# Patient Record
Sex: Male | Born: 1980 | Race: Black or African American | Hispanic: No | Marital: Married | Smoking: Current some day smoker
Health system: Southern US, Community
[De-identification: ages and names within clinical notes are randomized; demographics above are authoritative.]

## PROBLEM LIST (undated history)

## (undated) ENCOUNTER — Emergency Department (HOSPITAL_COMMUNITY): Admission: EM | Disposition: A | Discharge status: Home / Self Care | Payer: Medicaid Other

## (undated) ENCOUNTER — Emergency Department (HOSPITAL_BASED_OUTPATIENT_CLINIC_OR_DEPARTMENT_OTHER): Admission: EM | Payer: Medicaid Other | Source: Home / Self Care

## (undated) DIAGNOSIS — F1010 Alcohol abuse, uncomplicated: Secondary | ICD-10-CM

## (undated) DIAGNOSIS — R569 Unspecified convulsions: Secondary | ICD-10-CM

## (undated) HISTORY — PX: WISDOM TOOTH EXTRACTION: SHX21

---
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
## 2001-06-01 ENCOUNTER — Emergency Department (HOSPITAL_COMMUNITY): Admission: EM | Admit: 2001-06-01 | Discharge: 2001-06-01 | Payer: Self-pay | Admitting: Emergency Medicine

## 2002-11-04 ENCOUNTER — Emergency Department (HOSPITAL_COMMUNITY): Admission: EM | Admit: 2002-11-04 | Discharge: 2002-11-04 | Payer: Self-pay | Admitting: Emergency Medicine

## 2006-07-30 ENCOUNTER — Emergency Department (HOSPITAL_COMMUNITY): Admission: EM | Admit: 2006-07-30 | Discharge: 2006-07-30 | Payer: Self-pay | Admitting: Emergency Medicine

## 2006-08-07 ENCOUNTER — Emergency Department (HOSPITAL_COMMUNITY): Admission: EM | Admit: 2006-08-07 | Discharge: 2006-08-07 | Payer: Self-pay | Admitting: Emergency Medicine

## 2008-02-16 ENCOUNTER — Emergency Department (HOSPITAL_COMMUNITY): Admission: EM | Admit: 2008-02-16 | Discharge: 2008-02-16 | Payer: Self-pay | Admitting: Emergency Medicine

## 2009-05-07 ENCOUNTER — Emergency Department (HOSPITAL_COMMUNITY): Admission: EM | Admit: 2009-05-07 | Discharge: 2009-05-07 | Payer: Self-pay | Admitting: Emergency Medicine

## 2009-11-28 ENCOUNTER — Emergency Department (HOSPITAL_COMMUNITY): Admission: EM | Admit: 2009-11-28 | Discharge: 2009-11-28 | Payer: Self-pay | Admitting: Emergency Medicine

## 2010-07-31 ENCOUNTER — Emergency Department (HOSPITAL_COMMUNITY): Admission: EM | Admit: 2010-07-31 | Discharge: 2010-07-31 | Payer: Self-pay | Admitting: Emergency Medicine

## 2011-01-26 ENCOUNTER — Emergency Department (HOSPITAL_COMMUNITY)
Admission: EM | Admit: 2011-01-26 | Discharge: 2011-01-26 | Disposition: A | Discharge status: Home / Self Care | Payer: Medicaid Other

## 2011-01-26 ENCOUNTER — Emergency Department (HOSPITAL_COMMUNITY)
Admission: EM | Admit: 2011-01-26 | Discharge: 2011-01-26 | Disposition: A | Discharge status: Home / Self Care | Payer: Medicaid Other | Attending: Emergency Medicine | Admitting: Emergency Medicine

## 2011-01-26 DIAGNOSIS — R112 Nausea with vomiting, unspecified: Secondary | ICD-10-CM | POA: Insufficient documentation

## 2011-01-26 DIAGNOSIS — R109 Unspecified abdominal pain: Secondary | ICD-10-CM | POA: Insufficient documentation

## 2011-01-26 LAB — COMPREHENSIVE METABOLIC PANEL
ALT: 24 U/L (ref 0–53)
Albumin: 4.3 g/dL (ref 3.5–5.2)
Calcium: 9.3 mg/dL (ref 8.4–10.5)
GFR calc Af Amer: >60 mL/min (ref >60)
Glucose, Bld: 92 mg/dL (ref 70–99)
Total Protein: 8.3 g/dL (ref 6.0–8.3)

## 2011-01-26 LAB — CBC
HCT: 50.8 % (ref 39.0–52.0)
MCHC: 34.4 g/dL (ref 30.0–36.0)
MCV: 90.7 fL (ref 78.0–100.0)
RDW: 12 % (ref 11.5–15.5)

## 2011-01-26 LAB — DIFFERENTIAL
Basophils Relative: 0 % (ref 0–1)
Neutro Abs: 2.5 10*3/uL (ref 1.7–7.7)
Neutrophils Relative %: 49 % (ref 43–77)

## 2011-01-26 LAB — LIPASE, BLOOD: Lipase: 28 U/L (ref 11–59)

## 2011-02-06 LAB — URINALYSIS, ROUTINE W REFLEX MICROSCOPIC
Bilirubin Urine: NEGATIVE
Nitrite: NEGATIVE
Specific Gravity, Urine: 1.026 (ref 1.005–1.030)

## 2011-03-05 ENCOUNTER — Emergency Department (HOSPITAL_BASED_OUTPATIENT_CLINIC_OR_DEPARTMENT_OTHER)
Admission: EM | Admit: 2011-03-05 | Discharge: 2011-03-05 | Disposition: A | Discharge status: Home / Self Care | Payer: Medicaid Other | Attending: Emergency Medicine | Admitting: Emergency Medicine

## 2011-03-05 DIAGNOSIS — R1110 Vomiting, unspecified: Secondary | ICD-10-CM | POA: Insufficient documentation

## 2011-03-05 DIAGNOSIS — R109 Unspecified abdominal pain: Secondary | ICD-10-CM | POA: Insufficient documentation

## 2011-03-05 DIAGNOSIS — F1010 Alcohol abuse, uncomplicated: Secondary | ICD-10-CM | POA: Insufficient documentation

## 2011-03-05 LAB — COMPREHENSIVE METABOLIC PANEL
ALT: 18 U/L (ref 0–53)
Alkaline Phosphatase: 75 U/L (ref 39–117)
CO2: 24 mEq/L (ref 19–32)
GFR calc non Af Amer: >60 mL/min (ref >60)
Sodium: 140 mEq/L (ref 135–145)
Total Protein: 7.3 g/dL (ref 6.0–8.3)

## 2011-03-05 LAB — CBC
HCT: 44 % (ref 39.0–52.0)
MCH: 30.7 pg (ref 26.0–34.0)
MCHC: 34.8 g/dL (ref 30.0–36.0)
MCV: 88.2 fL (ref 78.0–100.0)
RDW: 12.4 % (ref 11.5–15.5)
WBC: 5.4 10*3/uL (ref 4.0–10.5)

## 2011-03-05 LAB — DIFFERENTIAL
Basophils Absolute: 0 10*3/uL (ref 0.0–0.1)
Eosinophils Relative: 6 % — ABNORMAL HIGH (ref 0–5)
Lymphocytes Relative: 19 % (ref 12–46)
Monocytes Absolute: 0.4 10*3/uL (ref 0.1–1.0)

## 2011-03-05 LAB — LIPASE, BLOOD: Lipase: 45 U/L (ref 23–300)

## 2011-03-05 LAB — ETHANOL: Alcohol, Ethyl (B): 27 mg/dL — ABNORMAL HIGH (ref 0–10)

## 2011-08-16 LAB — RAPID STREP SCREEN (MED CTR MEBANE ONLY): Streptococcus, Group A Screen (Direct): NEGATIVE

## 2012-02-23 ENCOUNTER — Encounter (HOSPITAL_COMMUNITY): Payer: Self-pay

## 2012-02-23 ENCOUNTER — Emergency Department (HOSPITAL_COMMUNITY)
Admission: EM | Admit: 2012-02-23 | Discharge: 2012-02-23 | Disposition: A | Discharge status: Home Health | Payer: Self-pay | Attending: Emergency Medicine | Admitting: Emergency Medicine

## 2012-02-23 ENCOUNTER — Emergency Department (HOSPITAL_COMMUNITY): Payer: Self-pay

## 2012-02-23 DIAGNOSIS — R300 Dysuria: Secondary | ICD-10-CM | POA: Insufficient documentation

## 2012-02-23 DIAGNOSIS — M545 Low back pain, unspecified: Secondary | ICD-10-CM | POA: Insufficient documentation

## 2012-02-23 DIAGNOSIS — F1010 Alcohol abuse, uncomplicated: Secondary | ICD-10-CM | POA: Insufficient documentation

## 2012-02-23 DIAGNOSIS — R112 Nausea with vomiting, unspecified: Secondary | ICD-10-CM | POA: Insufficient documentation

## 2012-02-23 DIAGNOSIS — M5416 Radiculopathy, lumbar region: Secondary | ICD-10-CM

## 2012-02-23 DIAGNOSIS — R209 Unspecified disturbances of skin sensation: Secondary | ICD-10-CM | POA: Insufficient documentation

## 2012-02-23 DIAGNOSIS — IMO0002 Reserved for concepts with insufficient information to code with codable children: Secondary | ICD-10-CM | POA: Insufficient documentation

## 2012-02-23 DIAGNOSIS — M79609 Pain in unspecified limb: Secondary | ICD-10-CM | POA: Insufficient documentation

## 2012-02-23 LAB — COMPREHENSIVE METABOLIC PANEL
ALT: 72 U/L — ABNORMAL HIGH (ref 0–53)
Albumin: 3.7 g/dL (ref 3.5–5.2)
Alkaline Phosphatase: 53 U/L (ref 39–117)
Calcium: 9.3 mg/dL (ref 8.4–10.5)
GFR calc Af Amer: >90 mL/min (ref >90)
Glucose, Bld: 99 mg/dL (ref 70–99)
Potassium: 3.6 mEq/L (ref 3.5–5.1)
Sodium: 136 mEq/L (ref 135–145)
Total Protein: 6.9 g/dL (ref 6.0–8.3)

## 2012-02-23 LAB — DIFFERENTIAL
Basophils Relative: 0 % (ref 0–1)
Eosinophils Absolute: 0.4 10*3/uL (ref 0.0–0.7)
Eosinophils Relative: 6 % — ABNORMAL HIGH (ref 0–5)
Lymphs Abs: 2.1 10*3/uL (ref 0.7–4.0)
Neutrophils Relative %: 52 % (ref 43–77)

## 2012-02-23 LAB — URINALYSIS, ROUTINE W REFLEX MICROSCOPIC
Bilirubin Urine: NEGATIVE
Nitrite: NEGATIVE
Specific Gravity, Urine: 1.009 (ref 1.005–1.030)
Urobilinogen, UA: 1 mg/dL (ref 0.0–1.0)
pH: 6 (ref 5.0–8.0)

## 2012-02-23 LAB — CBC
MCH: 32.1 pg (ref 26.0–34.0)
MCHC: 34.3 g/dL (ref 30.0–36.0)
MCV: 93.7 fL (ref 78.0–100.0)
Platelets: 237 10*3/uL (ref 150–400)
RDW: 12.5 % (ref 11.5–15.5)

## 2012-02-23 LAB — ETHANOL: Alcohol, Ethyl (B): <11 mg/dL (ref 0–11)

## 2012-02-23 LAB — RAPID URINE DRUG SCREEN, HOSP PERFORMED
Cocaine: NOT DETECTED
Opiates: NOT DETECTED

## 2012-02-23 LAB — LIPASE, BLOOD: Lipase: 25 U/L (ref 11–59)

## 2012-02-23 MED ORDER — HYDROCODONE-ACETAMINOPHEN 5-500 MG PO TABS: 1.0000 | ORAL_TABLET | Freq: Four times a day (QID) | ORAL | Status: AC | PRN

## 2012-02-23 MED ORDER — VITAMIN B-1 100 MG PO TABS
100.0000 mg | ORAL_TABLET | Freq: Every day | ORAL | Status: DC
  Administered 2012-02-23: 100 mg via ORAL
  Filled 2012-02-23: qty 1

## 2012-02-23 MED ORDER — CYCLOBENZAPRINE HCL 10 MG PO TABS: 5.0000 mg | ORAL_TABLET | Freq: Two times a day (BID) | ORAL | Status: AC | PRN

## 2012-02-23 MED ORDER — LORAZEPAM 1 MG PO TABS
2.0000 mg | ORAL_TABLET | Freq: Once | ORAL | Status: AC
  Administered 2012-02-23: 2 mg via ORAL
  Filled 2012-02-23: qty 2

## 2012-02-23 MED ORDER — ADULT MULTIVITAMIN W/MINERALS CH
1.0000 | ORAL_TABLET | Freq: Every day | ORAL | Status: DC
  Administered 2012-02-23: 1 via ORAL
  Filled 2012-02-23: qty 1

## 2012-02-23 MED ORDER — SODIUM CHLORIDE 0.9 % IV BOLUS (SEPSIS)
1000.0000 mL | Freq: Once | INTRAVENOUS | Status: AC
  Administered 2012-02-23: 1000 mL via INTRAVENOUS

## 2012-02-23 MED ORDER — MORPHINE SULFATE 4 MG/ML IJ SOLN
4.0000 mg | Freq: Once | INTRAMUSCULAR | Status: AC
  Administered 2012-02-23: 4 mg via INTRAVENOUS
  Filled 2012-02-23: qty 1

## 2012-02-23 MED ORDER — LORAZEPAM 1 MG PO TABS: 1.0000 mg | ORAL_TABLET | Freq: Four times a day (QID) | ORAL | Status: DC | PRN

## 2012-02-23 MED ORDER — LORAZEPAM 2 MG/ML IJ SOLN: 1.0000 mg | Freq: Four times a day (QID) | INTRAMUSCULAR | Status: DC | PRN

## 2012-02-23 MED ORDER — THIAMINE HCL 100 MG/ML IJ SOLN: 100.0000 mg | Freq: Every day | INTRAMUSCULAR | Status: DC

## 2012-02-23 MED ORDER — FOLIC ACID 1 MG PO TABS
1.0000 mg | ORAL_TABLET | Freq: Every day | ORAL | Status: DC
  Administered 2012-02-23: 1 mg via ORAL
  Filled 2012-02-23: qty 1

## 2012-02-23 NOTE — ED Provider Notes (Signed)
History     CSN: [id number]  Arrival date & time 02/23/12  0442   First MD Initiated Contact with Patient 02/23/12 0600      Chief Complaint  Patient presents with  . Alcohol Problem  . Back Pain    (Consider location/radiation/quality/duration/timing/severity/associated sxs/prior treatment) HPI  31 year old male presents with multiple complaints. Patient states he has been experiencing a gradual onset of low back pain for the past 2 days. He described pain as a sharp and throbbing sensation radiating to his right leg. Pain is intermittent, worsening with sitting or walking, and improves with lying flat. Pain is radiated into inner right thigh. Patient also experiencing intermittent tingling sensation to his right leg. Patient recalled was playing with his son a few days ago but denies any other significant trauma. He denies history of back pain. He does admits to ambulating extensively at his work. Patient denies fever, rash, IV drug use, or hematuria. He does notice some mild burning urination but denies urinary discharge. Burning urination has been ongoing for the past 2 days.  Denies red flags sxs.    Patient also admits to being an alcoholic and has been drinking excessively for the past 8 years. States his last alcohol use was 3 days ago, and states "I think i'm experiencing DT".  States he feels very shaky, nausea, occasional vomiting, and feeling very uncomfortable since last night. However, his primary concern is his back pain.  History reviewed. No pertinent past medical history.  History reviewed. No pertinent past surgical history.  History reviewed. No pertinent family history.  History  Substance Use Topics  . Smoking status: Not on file  . Smokeless tobacco: Not on file  . Alcohol Use: Yes      Review of Systems  All other systems reviewed and are negative.    Allergies  Review of patient's allergies indicates no known allergies.  Home Medications  No  current outpatient prescriptions on file.  BP 122/77  Pulse 83  Temp(Src) 98.4 F (36.9 C) (Oral)  Resp 20  SpO2 100%  Physical Exam  Nursing note and vitals reviewed. Constitutional: He appears well-developed and well-nourished. No distress.       Awake, alert, nontoxic appearance  HENT:  Head: Atraumatic.  Eyes: Conjunctivae are normal. Right eye exhibits no discharge. Left eye exhibits no discharge.  Neck: Normal range of motion. Neck supple.  Cardiovascular: Normal rate and regular rhythm.   Pulmonary/Chest: Effort normal. No respiratory distress. He exhibits no tenderness.  Abdominal: Soft. There is no tenderness. There is no rebound.  Musculoskeletal: He exhibits no tenderness.       ROM appears intact, no obvious focal weakness  No obvious midline tenderness, no step-off, no overlying skin changes, no rash. Tenderness to percussion to right low back no obvious CVA tenderness. Increasing pain with hip flexion especially to right leg. Normal deep tendon reflexes, normal strength, normal sensation throughout.  Neurological: He is alert.  Skin: Skin is warm and dry. No rash noted.  Psychiatric: He has a normal mood and affect.    ED Course  Procedures (including critical care time)  Labs Reviewed - No data to display No results found.   No diagnosis found.  Results for orders placed during the hospital encounter of 02/23/12  CBC      Component Value Range   WBC 6.4  4.0 - 10.5 (K/uL)   RBC 4.58  4.22 - 5.81 (MIL/uL)   Hemoglobin 14.7  13.0 - 17.0 (g/dL)  HCT 42.9  39.0 - 52.0 (%)   MCV 93.7  78.0 - 100.0 (fL)   MCH 32.1  26.0 - 34.0 (pg)   MCHC 34.3  30.0 - 36.0 (g/dL)   RDW 16.1  09.6 - 04.5 (%)   Platelets 237  150 - 400 (K/uL)  DIFFERENTIAL      Component Value Range   Neutrophils Relative 52  43 - 77 (%)   Neutro Abs 3.3  1.7 - 7.7 (K/uL)   Lymphocytes Relative 33  12 - 46 (%)   Lymphs Abs 2.1  0.7 - 4.0 (K/uL)   Monocytes Relative 9  3 - 12 (%)    Monocytes Absolute 0.6  0.1 - 1.0 (K/uL)   Eosinophils Relative 6 (*) 0 - 5 (%)   Eosinophils Absolute 0.4  0.0 - 0.7 (K/uL)   Basophils Relative 0  0 - 1 (%)   Basophils Absolute 0.0  0.0 - 0.1 (K/uL)  COMPREHENSIVE METABOLIC PANEL      Component Value Range   Sodium 136  135 - 145 (mEq/L)   Potassium 3.6  3.5 - 5.1 (mEq/L)   Chloride 102  96 - 112 (mEq/L)   CO2 28  19 - 32 (mEq/L)   Glucose, Bld 99  70 - 99 (mg/dL)   BUN 10  6 - 23 (mg/dL)   Creatinine, Ser 4.09  0.50 - 1.35 (mg/dL)   Calcium 9.3  8.4 - 81.1 (mg/dL)   Total Protein 6.9  6.0 - 8.3 (g/dL)   Albumin 3.7  3.5 - 5.2 (g/dL)   AST 914 (*) 0 - 37 (U/L)   ALT 72 (*) 0 - 53 (U/L)   Alkaline Phosphatase 53  39 - 117 (U/L)   Total Bilirubin 0.3  0.3 - 1.2 (mg/dL)   GFR calc non Af Amer 87 (*) >90 (mL/min)   GFR calc Af Amer >90  >90 (mL/min)  LIPASE, BLOOD      Component Value Range   Lipase 25  11 - 59 (U/L)  URINALYSIS, ROUTINE W REFLEX MICROSCOPIC      Component Value Range   Color, Urine YELLOW  YELLOW    APPearance CLEAR  CLEAR    Specific Gravity, Urine 1.009  1.005 - 1.030    pH 6.0  5.0 - 8.0    Glucose, UA NEGATIVE  NEGATIVE (mg/dL)   Hgb urine dipstick NEGATIVE  NEGATIVE    Bilirubin Urine NEGATIVE  NEGATIVE    Ketones, ur TRACE (*) NEGATIVE (mg/dL)   Protein, ur NEGATIVE  NEGATIVE (mg/dL)   Urobilinogen, UA 1.0  0.0 - 1.0 (mg/dL)   Nitrite NEGATIVE  NEGATIVE    Leukocytes, UA NEGATIVE  NEGATIVE   ETHANOL      Component Value Range   Alcohol, Ethyl (B) <11  0 - 11 (mg/dL)  URINE RAPID DRUG SCREEN (HOSP PERFORMED)      Component Value Range   Opiates NONE DETECTED  NONE DETECTED    Cocaine NONE DETECTED  NONE DETECTED    Benzodiazepines NONE DETECTED  NONE DETECTED    Amphetamines NONE DETECTED  NONE DETECTED    Tetrahydrocannabinol PENDING  NONE DETECTED    Barbiturates NONE DETECTED  NONE DETECTED    Dg Lumbar Spine Complete  02/23/2012  *RADIOLOGY REPORT*  Clinical Data: Low back pain extending  to the right hip and leg  LUMBAR SPINE - COMPLETE 4+ VIEW  Comparison: None.  Findings: Five lumbar-type vertebral bodies show normal alignment. Disc space heights are normal.  No facet pathology.  No other focal lesion.  Sacroiliac joints appear normal.  IMPRESSION: Normal radiographs  Original Report Authenticated By: Thomasenia Sales, M.D.      MDM  Lower back pain and R leg pain, suggestive of sciatica.  No red flags symptoms.    Hx of alcohol abuse, will fluid hydrates, and check labs.  I ask if pt would like help, he sts his primary concern is back pain at this time.  WIll offer resources for outpt f/u.     7:40 AM Result of back pain xray shows no significant changes.  Normal WBC. UA shows no signs of infection.  Pt's back pain improves with morphine and currently rating pain as 2/10. My attending has seen and evaluated pt.    7:54 AM Elevated AST and ALT, 103 and 72 respectively. This likely secondary to chronic alcohol use.   Fayrene Helper, PA-C 02/23/12 [phone]

## 2012-02-23 NOTE — ED Notes (Signed)
Pt complains of back pain, possibly from an injury at work, he also complains of the shakes, last drink two days ago, he states that he hasnt eaten in three or four days

## 2012-02-23 NOTE — ED Provider Notes (Signed)
Complains of low back pain radiating to left leg onset 2 days ago. He denies any injury pain is worse with flexing at the waist no loss of bladder or bowel control no fever. Patient also states he wants help with his alcohol problem. He's been an alcoholic for several years. Requesting outpatient facilities for help with alcohol. I exam awake alert no distress abdomen soft nontender back without spine tenderness he is able to flex at the waist without difficulty gait is normal  Doug Sou, MD 02/23/12 1611

## 2012-02-23 NOTE — Discharge Instructions (Signed)
Lumbosacral Radiculopathy Lumbosacral radiculopathy is a pinched nerve or nerves in the low back (lumbosacral area). When this happens you may have weakness in your legs and may not be able to stand on your toes. You may have pain going down into your legs. There may be difficulties with walking normally. There are many causes of this problem. Sometimes this may happen from an injury, or simply from arthritis or boney problems. It may also be caused by other illnesses such as diabetes. If there is no improvement after treatment, further studies may be done to find the exact cause. DIAGNOSIS  X-rays may be needed if the problems become long standing. Electromyograms may be done. This study is one in which the working of nerves and muscles is studied. HOME CARE INSTRUCTIONS   Applications of ice packs may be helpful. Ice can be used in a plastic bag with a towel around it to prevent frostbite to skin. This may be used every 2 hours for 20 to 30 minutes, or as needed, while awake, or as directed by your caregiver.   Only take over-the-counter or prescription medicines for pain, discomfort, or fever as directed by your caregiver.   If physical therapy was prescribed, follow your caregiver's directions.  SEEK IMMEDIATE MEDICAL CARE IF:   You have pain not controlled with medications.   You seem to be getting worse rather than better.   You develop increasing weakness in your legs.   You develop loss of bowel or bladder control.   You have difficulty with walking or balance, or develop clumsiness in the use of your legs.   You have a fever.  MAKE SURE YOU:   Understand these instructions.   Will watch your condition.   Will get help right away if you are not doing well or get worse.  Document Released: 11/08/2005 Document Revised: 10/28/2011 Document Reviewed: 06/28/2008 [facility] Patient Information 2012 [place], [place].  Alcohol Problems Most adults who drink alcohol drink in moderation  (not a lot) are at low risk for developing problems related to their drinking. However, all drinkers, including low-risk drinkers, should know about the health risks connected with drinking alcohol. RECOMMENDATIONS FOR LOW-RISK DRINKING  Drink in moderation. Moderate drinking is defined as follows:   Men - no more than 2 drinks per day.   Nonpregnant women - no more than 1 drink per day.   Over age 19 - no more than 1 drink per day.  A standard drink is 12 grams of pure alcohol, which is equal to a 12 ounce bottle of beer or wine cooler, a 5 ounce glass of wine, or 1.5 ounces of distilled spirits (such as whiskey, brandy, vodka, or rum).  ABSTAIN FROM (DO NOT DRINK) ALCOHOL:  When pregnant or considering pregnancy.   When taking a medication that interacts with alcohol.   If you are alcohol dependent.   A medical condition that prohibits drinking alcohol (such as ulcer, liver disease, or heart disease).  DISCUSS WITH YOUR CAREGIVER:  If you are at risk for coronary heart disease, discuss the potential benefits and risks of alcohol use: Light to moderate drinking is associated with lower rates of coronary heart disease in certain populations (for example, men over age 50 and postmenopausal women). Infrequent or nondrinkers are advised not to begin light to moderate drinking to reduce the risk of coronary heart disease so as to avoid creating an alcohol-related problem. Similar protective effects can likely be gained through proper diet and exercise.  Women and the elderly have smaller amounts of body water than men. As a result women and the elderly achieve a higher blood alcohol concentration after drinking the same amount of alcohol.   Exposing a fetus to alcohol can cause a broad range of birth defects referred to as Fetal Alcohol Syndrome (FAS) or Alcohol-Related Birth Defects (ARBD). Although FAS/ARBD is connected with excessive alcohol consumption during pregnancy, studies also have  reported neurobehavioral problems in infants born to mothers reporting drinking an average of 1 drink per day during pregnancy.   Heavier drinking (the consumption of more than 4 drinks per occasion by men and more than 3 drinks per occasion by women) impairs learning (cognitive) and psychomotor functions and increases the risk of alcohol-related problems, including accidents and injuries.  CAGE QUESTIONS:   Have you ever felt that you should Cut down on your drinking?   Have people Annoyed you by criticizing your drinking?   Have you ever felt bad or Guilty about your drinking?   Have you ever had a drink first thing in the morning to steady your nerves or get rid of a hangover (Eye opener)?  If you answered positively to any of these questions: You may be at risk for alcohol-related problems if alcohol consumption is:   Men: Greater than 14 drinks per week or more than 4 drinks per occasion.   Women: Greater than 7 drinks per week or more than 3 drinks per occasion.  Do you or your family have a medical history of alcohol-related problems, such as:  Blackouts.   Sexual dysfunction.   Depression.   Trauma.   Liver dysfunction.   Sleep disorders.   Hypertension.   Chronic abdominal pain.   Has your drinking ever caused you problems, such as problems with your family, problems with your work (or school) performance, or accidents/injuries?   Do you have a compulsion to drink or a preoccupation with drinking?   Do you have poor control or are you unable to stop drinking once you have started?   Do you have to drink to avoid withdrawal symptoms?   Do you have problems with withdrawal such as tremors, nausea, sweats, or mood disturbances?   Does it take more alcohol than in the past to get you high?   Do you feel a strong urge to drink?   Do you change your plans so that you can have a drink?   Do you ever drink in the morning to relieve the shakes or a hangover?  If  you have answered a number of the previous questions positively, it may be time for you to talk to your caregivers, family, and friends and see if they think you have a problem. Alcoholism is a chemical dependency that keeps getting worse and will eventually destroy your health and relationships. Many alcoholics end up dead, impoverished, or in prison. This is often the end result of all chemical dependency.  Do not be discouraged if you are not ready to take action immediately.   Decisions to change behavior often involve up and down desires to change and feeling like you cannot decide.   Try to think more seriously about your drinking behavior.   Think of the reasons to quit.  WHERE TO GO FOR ADDITIONAL INFORMATION   The National Institute on Alcohol Abuse and Alcoholism (NIAAA)www.niaaa.nih.gov   [employer] on Alcoholism and Drug Dependence (NCADD)www.ncadd.org   American Society of Addiction Medicine (ASAM)www.https://anderson-johnson.com/  Document Released: 11/08/2005 Document Revised: 10/28/2011  Document Reviewed: 06/26/2008 [facility] Patient Information 2012 [place], [place].   RESOURCE GUIDE  Dental Problems  Patients with Medicaid: [facility]                     [phone] [address]                                           Phone:  [phone]                                                  If unable to pay or uninsured, contact:  [facility] or [facility]. to become qualified for the adult dental clinic.  Chronic Pain Problems Contact [facility]  [phone] Patients need to be referred by their primary care doctor.  Insufficient Money for Medicine Contact [employer]:  call "211" or [facility] 228-644-3669.  No Primary Care Doctor Call [facility]  [phone] Other agencies that provide inexpensive medical care    [facility]  [phone]    [facility]  [phone]    [facility]   [phone]    [facility]  [phone]    [facility]  [phone]    [facility]  [phone]  Substance Abuse Resources [facility]  (719)379-6177 [facility] 612-083-8639 [facility] [phone] Floydene Flock [phone] Residential & Outpatient Substance Abuse Program  9301817497  Psychological Services [facility]  [phone] [facility]  ([phone] [facility]   ([phone] (emergency services [phone])  Abuse/Neglect [facility] Child Abuse Hotline (959)420-6055 [facility] Child Abuse Hotline 641-086-8326 (After Hours)  Emergency Shelter [facility] (949)228-5505  Maternity Homes [facility] [place] [facility] [phone] [facility] Alert Services [phone]  MRSA Hotline #:   724 003 1586    [facility] Resources  [facility]  [employer]                           [facility] [address] [place]                     [address]         [address]  Blondell Reveal Phone:  [phone]                                  Phone:  [phone]                   Phone:  [phone]  [facility] Phone:  [phone]  [facility] Child Abuse Hotline 7032565280 [phone] (After Hours)

## 2012-02-23 NOTE — ED Provider Notes (Signed)
Medical screening examination/treatment/procedure(s) were conducted as a shared visit with non-physician practitioner(s) and myself.  I personally evaluated the patient during the encounter  Doug Sou, MD 02/23/12 [phone]

## 2012-07-02 ENCOUNTER — Encounter (HOSPITAL_COMMUNITY): Payer: Self-pay | Admitting: Emergency Medicine

## 2012-07-02 ENCOUNTER — Emergency Department (HOSPITAL_COMMUNITY)
Admission: EM | Admit: 2012-07-02 | Discharge: 2012-07-02 | Disposition: A | Discharge status: Home / Self Care | Payer: Self-pay | Attending: Emergency Medicine | Admitting: Emergency Medicine

## 2012-07-02 DIAGNOSIS — F17200 Nicotine dependence, unspecified, uncomplicated: Secondary | ICD-10-CM | POA: Insufficient documentation

## 2012-07-02 DIAGNOSIS — H109 Unspecified conjunctivitis: Secondary | ICD-10-CM | POA: Insufficient documentation

## 2012-07-02 MED ORDER — TOBRAMYCIN 0.3 % OP SOLN
2.0000 [drp] | Freq: Four times a day (QID) | OPHTHALMIC | Status: DC
  Administered 2012-07-02: 2 [drp] via OPHTHALMIC
  Filled 2012-07-02: qty 5

## 2012-07-02 NOTE — ED Provider Notes (Signed)
History     CSN: [id number]  Arrival date & time 07/02/12  1113   First MD Initiated Contact with Patient 07/02/12 1249      Chief Complaint  Patient presents with  . Eye Pain    (Consider location/radiation/quality/duration/timing/severity/associated sxs/prior treatment) Patient is a 31 y.o. male presenting with eye pain. The history is provided by the patient.  Eye Pain This is a new problem. The current episode started in the past 7 days. The problem occurs constantly. The problem has been unchanged. Pertinent negatives include no fever. Associated symptoms comments: Eye redness and burning with matting in the morning x 2-3 days isolated to left eye. No visual changes. No injury. Marland Kitchen    History reviewed. No pertinent past medical history.  History reviewed. No pertinent past surgical history.  No family history on file.  History  Substance Use Topics  . Smoking status: Current Everyday Smoker -- 0.2 packs/day  . Smokeless tobacco: Never Used  . Alcohol Use: Yes     2 pints cognac weekly      Review of Systems  Constitutional: Negative for fever.  HENT: Negative for facial swelling.   Eyes: Positive for pain, discharge and redness.    Allergies  Review of patient's allergies indicates no known allergies.  Home Medications   Current Outpatient Rx  Name Route Sig Dispense Refill  . ADULT MULTIVITAMIN W/MINERALS CH Oral Take 1 tablet by mouth daily.      BP 121/78  Pulse 57  Temp 97.9 F (36.6 C) (Oral)  Resp 18  SpO2 100%  Physical Exam  Constitutional: He is oriented to person, place, and time. He appears well-developed and well-nourished. No distress.  HENT:  Head: Atraumatic.  Eyes: EOM and lids are normal. Pupils are equal, round, and reactive to light. Right eye exhibits no discharge and no exudate. No foreign body present in the right eye. Left eye exhibits no discharge and no exudate. No foreign body present in the left eye. Right conjunctiva is not  injected. Right conjunctiva has no hemorrhage. Left conjunctiva is injected. Left conjunctiva has no hemorrhage. Right eye exhibits normal extraocular motion. Left eye exhibits normal extraocular motion.       Left eye tearing without purulent drainage. Conjunctiva minimally edematous and with moderate redness.  Neck: Normal range of motion.  Pulmonary/Chest: Effort normal.  Lymphadenopathy:    He has no cervical adenopathy.  Neurological: He is alert and oriented to person, place, and time.  Psychiatric: He has a normal mood and affect.    ED Course  Procedures (including critical care time)  Labs Reviewed - No data to display No results found.   No diagnosis found.  1. conjunctivitis   MDM  Uncomplicated conjunctivitis, left. Tobramycin soln. Given. Refer to ophthalmology for persistent symptoms.         Rodena Medin, PA-C 07/02/12 1347

## 2012-07-02 NOTE — ED Provider Notes (Signed)
Medical screening examination/treatment/procedure(s) were performed by non-physician practitioner and as supervising physician I was immediately available for consultation/collaboration.  Hebert Dooling, MD 07/02/12 1723 
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
## 2012-07-31 ENCOUNTER — Emergency Department (HOSPITAL_COMMUNITY)
Admission: EM | Admit: 2012-07-31 | Discharge: 2012-08-01 | Disposition: A | Discharge status: Home / Self Care | Payer: Self-pay | Attending: Emergency Medicine | Admitting: Emergency Medicine

## 2012-07-31 ENCOUNTER — Emergency Department (HOSPITAL_COMMUNITY): Payer: Self-pay

## 2012-07-31 ENCOUNTER — Encounter (HOSPITAL_COMMUNITY): Payer: Self-pay | Admitting: Emergency Medicine

## 2012-07-31 DIAGNOSIS — Z8249 Family history of ischemic heart disease and other diseases of the circulatory system: Secondary | ICD-10-CM | POA: Insufficient documentation

## 2012-07-31 DIAGNOSIS — F17200 Nicotine dependence, unspecified, uncomplicated: Secondary | ICD-10-CM | POA: Insufficient documentation

## 2012-07-31 DIAGNOSIS — Z809 Family history of malignant neoplasm, unspecified: Secondary | ICD-10-CM | POA: Insufficient documentation

## 2012-07-31 DIAGNOSIS — S42309A Unspecified fracture of shaft of humerus, unspecified arm, initial encounter for closed fracture: Secondary | ICD-10-CM | POA: Insufficient documentation
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
## 2012-08-01 MED ORDER — HYDROCODONE-ACETAMINOPHEN 5-325 MG PO TABS
2.0000 | ORAL_TABLET | Freq: Once | ORAL | Status: AC
  Administered 2012-08-01: 2 via ORAL
  Filled 2012-08-01: qty 2

## 2012-08-01 MED ORDER — OXYCODONE-ACETAMINOPHEN 7.5-325 MG PO TABS: 1.0000 | ORAL_TABLET | ORAL | Status: AC | PRN
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
## 2013-02-04 ENCOUNTER — Encounter (HOSPITAL_COMMUNITY): Payer: Self-pay | Admitting: Emergency Medicine

## 2013-02-04 ENCOUNTER — Emergency Department (HOSPITAL_COMMUNITY)
Admission: EM | Admit: 2013-02-04 | Discharge: 2013-02-05 | Disposition: A | Discharge status: Other Acute Inpatient Hospital | Payer: Self-pay | Attending: Emergency Medicine | Admitting: Emergency Medicine

## 2013-02-04 DIAGNOSIS — F17200 Nicotine dependence, unspecified, uncomplicated: Secondary | ICD-10-CM | POA: Insufficient documentation

## 2013-02-04 DIAGNOSIS — Z8669 Personal history of other diseases of the nervous system and sense organs: Secondary | ICD-10-CM | POA: Insufficient documentation

## 2013-02-04 DIAGNOSIS — R6883 Chills (without fever): Secondary | ICD-10-CM | POA: Insufficient documentation

## 2013-02-04 DIAGNOSIS — R1012 Left upper quadrant pain: Secondary | ICD-10-CM | POA: Insufficient documentation

## 2013-02-04 DIAGNOSIS — R112 Nausea with vomiting, unspecified: Secondary | ICD-10-CM | POA: Insufficient documentation

## 2013-02-04 DIAGNOSIS — F1020 Alcohol dependence, uncomplicated: Secondary | ICD-10-CM | POA: Insufficient documentation

## 2013-02-04 HISTORY — DX: Alcohol abuse, uncomplicated: F10.10

## 2013-02-04 HISTORY — DX: Unspecified convulsions: R56.9

## 2013-02-04 LAB — CBC WITH DIFFERENTIAL/PLATELET
Basophils Absolute: 0 K/uL (ref 0.0–0.1)
Basophils Relative: 1 % (ref 0–1)
Eosinophils Absolute: 0.3 K/uL (ref 0.0–0.7)
Eosinophils Relative: 4 % (ref 0–5)
HCT: 41.7 % (ref 39.0–52.0)
Hemoglobin: 14.6 g/dL (ref 13.0–17.0)
Lymphocytes Relative: 27 % (ref 12–46)
Lymphs Abs: 2 K/uL (ref 0.7–4.0)
MCH: 32.5 pg (ref 26.0–34.0)
MCHC: 35 g/dL (ref 30.0–36.0)
MCV: 92.9 fL (ref 78.0–100.0)
Monocytes Absolute: 0.6 K/uL (ref 0.1–1.0)
Monocytes Relative: 9 % (ref 3–12)
Neutro Abs: 4.4 K/uL (ref 1.7–7.7)
Neutrophils Relative %: 60 % (ref 43–77)
Platelets: 247 K/uL (ref 150–400)
RBC: 4.49 MIL/uL (ref 4.22–5.81)
RDW: 13 % (ref 11.5–15.5)
WBC: 7.4 K/uL (ref 4.0–10.5)

## 2013-02-04 MED ORDER — VITAMIN B-1 100 MG PO TABS
100.0000 mg | ORAL_TABLET | Freq: Every day | ORAL | Status: DC
  Administered 2013-02-05: 100 mg via ORAL
  Filled 2013-02-04: qty 1

## 2013-02-04 MED ORDER — ONDANSETRON HCL 4 MG/2ML IJ SOLN
4.0000 mg | Freq: Once | INTRAMUSCULAR | Status: AC
  Administered 2013-02-04: 4 mg via INTRAVENOUS
  Filled 2013-02-04: qty 2

## 2013-02-04 MED ORDER — LORAZEPAM 2 MG/ML IJ SOLN: 0.0000 mg | Freq: Two times a day (BID) | INTRAMUSCULAR | Status: DC

## 2013-02-04 MED ORDER — THIAMINE HCL 100 MG/ML IJ SOLN: 100.0000 mg | Freq: Every day | INTRAMUSCULAR | Status: DC

## 2013-02-04 MED ORDER — ADULT MULTIVITAMIN W/MINERALS CH
1.0000 | ORAL_TABLET | Freq: Every day | ORAL | Status: DC
Start: 2013-02-05 — End: 2013-02-05
  Administered 2013-02-05: 1 via ORAL
  Filled 2013-02-04: qty 1

## 2013-02-04 MED ORDER — LORAZEPAM 1 MG PO TABS: 1.0000 mg | ORAL_TABLET | Freq: Four times a day (QID) | ORAL | Status: DC | PRN

## 2013-02-04 MED ORDER — SODIUM CHLORIDE 0.9 % IV BOLUS (SEPSIS)
1000.0000 mL | Freq: Once | INTRAVENOUS | Status: AC
Start: 2013-02-04 — End: 2013-02-05
  Administered 2013-02-04: 1000 mL via INTRAVENOUS

## 2013-02-04 MED ORDER — LORAZEPAM 2 MG/ML IJ SOLN: 1.0000 mg | Freq: Four times a day (QID) | INTRAMUSCULAR | Status: DC | PRN

## 2013-02-04 MED ORDER — LORAZEPAM 2 MG/ML IJ SOLN: 0.0000 mg | Freq: Four times a day (QID) | INTRAMUSCULAR | Status: DC

## 2013-02-04 MED ORDER — FOLIC ACID 1 MG PO TABS
1.0000 mg | ORAL_TABLET | Freq: Every day | ORAL | Status: DC
  Administered 2013-02-05: 1 mg via ORAL
  Filled 2013-02-04: qty 1

## 2013-02-04 MED ORDER — LORAZEPAM 2 MG/ML IJ SOLN
1.0000 mg | Freq: Once | INTRAMUSCULAR | Status: AC
  Administered 2013-02-04: 1 mg via INTRAVENOUS
  Filled 2013-02-04: qty 1

## 2013-02-04 NOTE — ED Provider Notes (Signed)
History     CSN: [id number]  Arrival date & time 02/04/13  2147   First MD Initiated Contact with Patient 02/04/13 2226      Chief Complaint  Patient presents with  . Emesis   HPI  History provided by the patient and significant other. Patient is a 32 year old male with history of alcohol abuse who presents with requests for help with detox and symptoms of nausea vomiting. Patient reports drinking at least a fifth of liquor daily. Today he is making an effort to not drink any longer and has not had any alcohol. This afternoon he began to have several episodes of projectile vomiting. He complains of some left upper quadrant abdominal pain and discomfort. Symptoms are described as moderate. He denies any other aggravating or alleviating factors. Denies any fever, chills or sweats. Denies any other associated symptoms. Denies any other drug use.    Past Medical History  Diagnosis Date  . 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 Alcohol abuse     History reviewed. No pertinent past surgical history.  Family History  Problem Relation Age of Onset  . Cancer Other   . Hypertension Other     History  Substance Use Topics  . Smoking status: Current Some Day Smoker -- 0.25 packs/day    Types: Cigarettes  . Smokeless tobacco: Never Used  . Alcohol Use: Yes     Comment: 2 pints cognac weekly, last drink 3/17      Review of Systems  Constitutional: Positive for chills. Negative for fever.  Respiratory: Negative for shortness of breath.   Cardiovascular: Negative for chest pain.  Gastrointestinal: Positive for nausea, vomiting and abdominal pain. Negative for diarrhea and constipation.  Skin: Negative for rash.  All other systems reviewed and are negative.    Allergies  Review of patient's allergies indicates no known allergies.  Home Medications   Current Outpatient Rx  Name  Route  Sig  Dispense  Refill  . ibuprofen (ADVIL,MOTRIN) 200 MG tablet   Oral   Take 600 mg by mouth  every 6 (six) hours as needed. Pain         . Multiple Vitamin (MULTIVITAMIN WITH MINERALS) TABS   Oral   Take 1 tablet by mouth daily.           BP 127/94  Pulse 67  Temp(Src) 98.2 F (36.8 C) (Oral)  Ht 6\' 2"  (1.88 m)  Wt 198 lb (89.812 kg)  BMI 25.41 kg/m2  SpO2 100%  Physical Exam  Nursing note and vitals reviewed. Constitutional: He is oriented to person, place, and time. He appears well-developed and well-nourished.  HENT:  Head: Normocephalic.  Cardiovascular: Normal rate and regular rhythm.   Pulmonary/Chest: Effort normal and breath sounds normal. No respiratory distress.  Abdominal: Soft. There is tenderness in the epigastric area and left upper quadrant. There is no rebound, no guarding, no CVA tenderness, no tenderness at McBurney's point and negative Murphy's sign.  Musculoskeletal: Normal range of motion.  Neurological: He is alert and oriented to person, place, and time.  Skin: Skin is warm.  Psychiatric: He has a normal mood and affect. His behavior is normal.    ED Course  Procedures   Results for orders placed during the hospital encounter of 02/04/13  CBC WITH DIFFERENTIAL      Result Value Range   WBC 7.4  4.0 - 10.5 K/uL   RBC 4.49  4.22 - 5.81 MIL/uL   Hemoglobin 14.6  13.0 - 17.0 g/dL   HCT 16.1  09.6 - 04.5 %   MCV 92.9  78.0 - 100.0 fL   MCH 32.5  26.0 - 34.0 pg   MCHC 35.0  30.0 - 36.0 g/dL   RDW 40.9  81.1 - 91.4 %   Platelets 247  150 - 400 K/uL   Neutrophils Relative 60  43 - 77 %   Neutro Abs 4.4  1.7 - 7.7 K/uL   Lymphocytes Relative 27  12 - 46 %   Lymphs Abs 2.0  0.7 - 4.0 K/uL   Monocytes Relative 9  3 - 12 %   Monocytes Absolute 0.6  0.1 - 1.0 K/uL   Eosinophils Relative 4  0 - 5 %   Eosinophils Absolute 0.3  0.0 - 0.7 K/uL   Basophils Relative 1  0 - 1 %   Basophils Absolute 0.0  0.0 - 0.1 K/uL  COMPREHENSIVE METABOLIC PANEL      Result Value Range   Sodium 132 (*) 135 - 145 mEq/L   Potassium 6.9 (*) 3.5 - 5.1 mEq/L    Chloride 96  96 - 112 mEq/L   CO2 27  19 - 32 mEq/L   Glucose, Bld 80  70 - 99 mg/dL   BUN 14  6 - 23 mg/dL   Creatinine, Ser 7.82  0.50 - 1.35 mg/dL   Calcium 8.7  8.4 - 95.6 mg/dL   Total Protein 7.4  6.0 - 8.3 g/dL   Albumin 4.3  3.5 - 5.2 g/dL   AST 50 (*) 0 - 37 U/L   ALT 19  0 - 53 U/L   Alkaline Phosphatase 38 (*) 39 - 117 U/L   Total Bilirubin 0.7  0.3 - 1.2 mg/dL   GFR calc non Af Amer 89 (*) >90 mL/min   GFR calc Af Amer >90  >90 mL/min  LIPASE, BLOOD      Result Value Range   Lipase 27  11 - 59 U/L  ETHANOL      Result Value Range   Alcohol, Ethyl (B) <11  0 - 11 mg/dL  URINALYSIS, ROUTINE W REFLEX MICROSCOPIC      Result Value Range   Color, Urine YELLOW  YELLOW   APPearance CLEAR  CLEAR   Specific Gravity, Urine 1.019  1.005 - 1.030   pH 6.0  5.0 - 8.0   Glucose, UA NEGATIVE  NEGATIVE mg/dL   Hgb urine dipstick NEGATIVE  NEGATIVE   Bilirubin Urine NEGATIVE  NEGATIVE   Ketones, ur 40 (*) NEGATIVE mg/dL   Protein, ur NEGATIVE  NEGATIVE mg/dL   Urobilinogen, UA 1.0  0.0 - 1.0 mg/dL   Nitrite NEGATIVE  NEGATIVE   Leukocytes, UA NEGATIVE  NEGATIVE  URINE RAPID DRUG SCREEN (HOSP PERFORMED)      Result Value Range   Opiates NONE DETECTED  NONE DETECTED   Cocaine NONE DETECTED  NONE DETECTED   Benzodiazepines NONE DETECTED  NONE DETECTED   Amphetamines NONE DETECTED  NONE DETECTED   Tetrahydrocannabinol POSITIVE (*) NONE DETECTED   Barbiturates NONE DETECTED  NONE DETECTED  POTASSIUM      Result Value Range   Potassium 3.7  3.5 - 5.1 mEq/L         1. Alcoholism /alcohol abuse       MDM  10:25PM patient seen and evaluated. Patient appears in mild discomfort. No acute distress.   Potassium found to be elevated. Possibly this is due to hemolysis. Patient discussed  with attending physician. EKG ordered. D5 ordered with 10 units insulin. Repeat potassium also ordered.  EKG without any concerning T wave changes. Normal sinus rhythm.  Recheck her  potassium is normal. First reading was likely hemolyzed.  Spoke with Tammy Sours with [facility] act team. He'll see patient and evaluate for alcohol detox treatments.     Date: 02/05/2013  Rate: 63  Rhythm: normal sinus rhythm  QRS Axis: normal  Intervals: normal  ST/T Wave abnormalities: normal  Conduction Disutrbances:none  Narrative Interpretation:   Old EKG Reviewed: none available       Angus Seller, PA-C 02/05/13 [phone]

## 2013-02-04 NOTE — ED Notes (Signed)
Pt c/o projectile vomiting 1 hour ago. Pt also c/o diff breathing, HA. Pt usually everyday drinker. Pt has not had etoh since yesterday.

## 2013-02-05 LAB — URINALYSIS, ROUTINE W REFLEX MICROSCOPIC
Hgb urine dipstick: NEGATIVE
Nitrite: NEGATIVE
Protein, ur: NEGATIVE mg/dL
Urobilinogen, UA: 1 mg/dL (ref 0.0–1.0)

## 2013-02-05 LAB — COMPREHENSIVE METABOLIC PANEL
Alkaline Phosphatase: 38 U/L — ABNORMAL LOW (ref 39–117)
BUN: 14 mg/dL (ref 6–23)
Calcium: 8.7 mg/dL (ref 8.4–10.5)
Creatinine, Ser: 1.1 mg/dL (ref 0.50–1.35)
GFR calc Af Amer: >90 mL/min (ref >90)
Glucose, Bld: 80 mg/dL (ref 70–99)
Potassium: 6.9 mEq/L (ref 3.5–5.1)
Total Protein: 7.4 g/dL (ref 6.0–8.3)

## 2013-02-05 LAB — LIPASE, BLOOD: Lipase: 27 U/L (ref 11–59)

## 2013-02-05 LAB — ETHANOL: Alcohol, Ethyl (B): <11 mg/dL (ref 0–11)

## 2013-02-05 LAB — RAPID URINE DRUG SCREEN, HOSP PERFORMED
Barbiturates: NOT DETECTED
Opiates: NOT DETECTED
Tetrahydrocannabinol: POSITIVE — AB

## 2013-02-05 MED ORDER — DEXTROSE 50 % IV SOLN: 1.0000 | Freq: Once | INTRAVENOUS | Status: DC

## 2013-02-05 MED ORDER — INSULIN REGULAR HUMAN 100 UNIT/ML IJ SOLN: 10.0000 [IU] | Freq: Once | INTRAMUSCULAR | Status: DC

## 2013-02-05 MED ORDER — INSULIN ASPART 100 UNIT/ML ~~LOC~~ SOLN: 10.0000 [IU] | Freq: Once | SUBCUTANEOUS | Status: DC

## 2013-02-05 NOTE — ED Provider Notes (Signed)
Pt stable awaiting placement  Kolden Dupee L Daiana Vitiello, MD 02/05/13 0719 

## 2013-02-05 NOTE — [facility] Counselor (Signed)
Per Drema Pry, patient accepted to [facility]. The driver is in route and will transport patient to the facility.

## 2013-02-05 NOTE — ED Provider Notes (Signed)
Medical screening examination/treatment/procedure(s) were performed by non-physician practitioner and as supervising physician I was immediately available for consultation/collaboration.   Arlene Brickel, MD 02/05/13 1528 

## 2013-02-05 NOTE — ED Notes (Signed)
RN stated that he will inform phleb to draw lab for potassium.  Pt is receiving treatment.

## 2013-02-05 NOTE — ED Notes (Signed)
Pt is sleeping soundly.  Is arousable  but is lethargic upon waking.  Ativan order held because of lethargic condition.

## 2013-02-05 NOTE — ED Notes (Signed)
Pt girlfriends contact number is [phone]
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
## 2013-02-05 NOTE — ED Notes (Signed)
3 bags pt belongings placed in locker 27.

## 2013-02-18 ENCOUNTER — Encounter (HOSPITAL_COMMUNITY): Payer: Self-pay | Admitting: Emergency Medicine

## 2013-02-18 ENCOUNTER — Emergency Department (HOSPITAL_COMMUNITY)
Admission: EM | Admit: 2013-02-18 | Discharge: 2013-02-18 | Disposition: A | Discharge status: Home / Self Care | Payer: Medicaid Other | Attending: Emergency Medicine | Admitting: Emergency Medicine

## 2013-02-18 ENCOUNTER — Emergency Department (HOSPITAL_COMMUNITY): Payer: Medicaid Other

## 2013-02-18 DIAGNOSIS — Z79899 Other long term (current) drug therapy: Secondary | ICD-10-CM | POA: Insufficient documentation

## 2013-02-18 DIAGNOSIS — F411 Generalized anxiety disorder: Secondary | ICD-10-CM | POA: Insufficient documentation

## 2013-02-18 DIAGNOSIS — F1010 Alcohol abuse, uncomplicated: Secondary | ICD-10-CM | POA: Insufficient documentation

## 2013-02-18 DIAGNOSIS — F17200 Nicotine dependence, unspecified, uncomplicated: Secondary | ICD-10-CM | POA: Insufficient documentation

## 2013-02-18 DIAGNOSIS — S46909A Unspecified injury of unspecified muscle, fascia and tendon at shoulder and upper arm level, unspecified arm, initial encounter: Secondary | ICD-10-CM | POA: Insufficient documentation

## 2013-02-18 DIAGNOSIS — S4980XA Other specified injuries of shoulder and upper arm, unspecified arm, initial encounter: Secondary | ICD-10-CM | POA: Insufficient documentation

## 2013-02-18 DIAGNOSIS — F911 Conduct disorder, childhood-onset type: Secondary | ICD-10-CM | POA: Insufficient documentation

## 2013-02-18 DIAGNOSIS — Z8669 Personal history of other diseases of the nervous system and sense organs: Secondary | ICD-10-CM | POA: Insufficient documentation

## 2013-02-18 MED ORDER — IBUPROFEN 600 MG PO TABS: 600.0000 mg | ORAL_TABLET | Freq: Four times a day (QID) | ORAL | Status: DC | PRN
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
## 2013-02-18 NOTE — ED Notes (Signed)
+  CMS, no deformity noted to L shoulder

## 2013-02-18 NOTE — ED Notes (Signed)
Pt mother in [place], spoke with PA

## 2013-02-19 NOTE — ED Provider Notes (Signed)
Medical screening examination/treatment/procedure(s) were performed by non-physician practitioner and as supervising physician I was immediately available for consultation/collaboration.  Derwood Kaplan, MD 02/19/13 0028

## 2013-10-13 ENCOUNTER — Emergency Department (HOSPITAL_COMMUNITY)
Admission: EM | Admit: 2013-10-13 | Discharge: 2013-10-14 | Disposition: A | Discharge status: Home / Self Care | Payer: Medicaid Other | Attending: Emergency Medicine | Admitting: Emergency Medicine

## 2013-10-13 ENCOUNTER — Encounter (HOSPITAL_COMMUNITY): Payer: Self-pay | Admitting: Emergency Medicine

## 2013-10-13 ENCOUNTER — Emergency Department (HOSPITAL_COMMUNITY): Payer: Medicaid Other

## 2013-10-13 DIAGNOSIS — F1010 Alcohol abuse, uncomplicated: Secondary | ICD-10-CM | POA: Insufficient documentation

## 2013-10-13 DIAGNOSIS — Z8669 Personal history of other diseases of the nervous system and sense organs: Secondary | ICD-10-CM | POA: Insufficient documentation

## 2013-10-13 DIAGNOSIS — S032XXA Dislocation of tooth, initial encounter: Secondary | ICD-10-CM

## 2013-10-13 DIAGNOSIS — R2681 Unsteadiness on feet: Secondary | ICD-10-CM

## 2013-10-13 DIAGNOSIS — F17200 Nicotine dependence, unspecified, uncomplicated: Secondary | ICD-10-CM | POA: Insufficient documentation

## 2013-10-13 DIAGNOSIS — R5381 Other malaise: Secondary | ICD-10-CM | POA: Insufficient documentation

## 2013-10-13 DIAGNOSIS — Z23 Encounter for immunization: Secondary | ICD-10-CM | POA: Insufficient documentation

## 2013-10-13 DIAGNOSIS — S79919A Unspecified injury of unspecified hip, initial encounter: Secondary | ICD-10-CM | POA: Insufficient documentation

## 2013-10-13 DIAGNOSIS — S060X9A Concussion with loss of consciousness of unspecified duration, initial encounter: Secondary | ICD-10-CM

## 2013-10-13 DIAGNOSIS — R42 Dizziness and giddiness: Secondary | ICD-10-CM | POA: Insufficient documentation

## 2013-10-13 DIAGNOSIS — H538 Other visual disturbances: Secondary | ICD-10-CM | POA: Insufficient documentation

## 2013-10-13 DIAGNOSIS — F29 Unspecified psychosis not due to a substance or known physiological condition: Secondary | ICD-10-CM | POA: Insufficient documentation

## 2013-10-13 DIAGNOSIS — S3981XA Other specified injuries of abdomen, initial encounter: Secondary | ICD-10-CM | POA: Insufficient documentation

## 2013-10-13 DIAGNOSIS — Z79899 Other long term (current) drug therapy: Secondary | ICD-10-CM | POA: Insufficient documentation

## 2013-10-13 DIAGNOSIS — R269 Unspecified abnormalities of gait and mobility: Secondary | ICD-10-CM | POA: Insufficient documentation

## 2013-10-13 DIAGNOSIS — S91109A Unspecified open wound of unspecified toe(s) without damage to nail, initial encounter: Secondary | ICD-10-CM | POA: Insufficient documentation

## 2013-10-13 MED ORDER — TETANUS-DIPHTH-ACELL PERTUSSIS 5-2.5-18.5 LF-MCG/0.5 IM SUSP
0.5000 mL | Freq: Once | INTRAMUSCULAR | Status: AC
  Administered 2013-10-14: 0.5 mL via INTRAMUSCULAR
  Filled 2013-10-13: qty 0.5

## 2013-10-13 MED ORDER — SODIUM CHLORIDE 0.9 % IV BOLUS (SEPSIS)
1000.0000 mL | Freq: Once | INTRAVENOUS | Status: AC
  Administered 2013-10-14: 1000 mL via INTRAVENOUS
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
## 2013-10-14 LAB — URINALYSIS, ROUTINE W REFLEX MICROSCOPIC
Hgb urine dipstick: NEGATIVE
Leukocytes, UA: NEGATIVE
Nitrite: NEGATIVE
Specific Gravity, Urine: 1.003 — ABNORMAL LOW (ref 1.005–1.030)
Urobilinogen, UA: 1 mg/dL (ref 0.0–1.0)

## 2013-10-14 LAB — CBC WITH DIFFERENTIAL/PLATELET
Eosinophils Absolute: 0.4 10*3/uL (ref 0.0–0.7)
Lymphs Abs: 2 10*3/uL (ref 0.7–4.0)
MCH: 31.9 pg (ref 26.0–34.0)
Neutrophils Relative %: 51 % (ref 43–77)
Platelets: 225 10*3/uL (ref 150–400)
RBC: 4.96 MIL/uL (ref 4.22–5.81)
WBC: 6.2 10*3/uL (ref 4.0–10.5)

## 2013-10-14 LAB — BASIC METABOLIC PANEL
GFR calc Af Amer: >90 mL/min (ref >90)
GFR calc non Af Amer: >90 mL/min (ref >90)
Glucose, Bld: 94 mg/dL (ref 70–99)
Potassium: 3.4 mEq/L — ABNORMAL LOW (ref 3.5–5.1)
Sodium: 133 mEq/L — ABNORMAL LOW (ref 135–145)

## 2013-10-14 MED ORDER — PROMETHAZINE HCL 25 MG PO TABS: 25.0000 mg | ORAL_TABLET | Freq: Four times a day (QID) | ORAL | Status: DC | PRN

## 2013-10-14 MED ORDER — IBUPROFEN 400 MG PO TABS: 400.0000 mg | ORAL_TABLET | Freq: Four times a day (QID) | ORAL | Status: DC | PRN
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
## 2013-10-24 ENCOUNTER — Ambulatory Visit: Payer: Self-pay | Admitting: Diagnostic Neuroimaging

## 2014-02-15 ENCOUNTER — Emergency Department (HOSPITAL_COMMUNITY): Payer: Medicaid Other

## 2014-02-15 ENCOUNTER — Emergency Department (HOSPITAL_COMMUNITY)
Admission: EM | Admit: 2014-02-15 | Discharge: 2014-02-15 | Disposition: A | Discharge status: Home / Self Care | Payer: Medicaid Other | Attending: Emergency Medicine | Admitting: Emergency Medicine

## 2014-02-15 ENCOUNTER — Encounter (HOSPITAL_COMMUNITY): Payer: Self-pay | Admitting: Emergency Medicine

## 2014-02-15 DIAGNOSIS — R413 Other amnesia: Secondary | ICD-10-CM | POA: Insufficient documentation

## 2014-02-15 DIAGNOSIS — R5381 Other malaise: Secondary | ICD-10-CM | POA: Insufficient documentation

## 2014-02-15 DIAGNOSIS — R5383 Other fatigue: Secondary | ICD-10-CM

## 2014-02-15 DIAGNOSIS — R519 Headache, unspecified: Secondary | ICD-10-CM

## 2014-02-15 DIAGNOSIS — F17200 Nicotine dependence, unspecified, uncomplicated: Secondary | ICD-10-CM | POA: Insufficient documentation

## 2014-02-15 DIAGNOSIS — F411 Generalized anxiety disorder: Secondary | ICD-10-CM | POA: Insufficient documentation

## 2014-02-15 DIAGNOSIS — R51 Headache: Secondary | ICD-10-CM | POA: Insufficient documentation

## 2014-02-15 DIAGNOSIS — M79609 Pain in unspecified limb: Secondary | ICD-10-CM | POA: Insufficient documentation

## 2014-02-15 DIAGNOSIS — F39 Unspecified mood [affective] disorder: Secondary | ICD-10-CM | POA: Insufficient documentation

## 2014-02-15 DIAGNOSIS — Z79899 Other long term (current) drug therapy: Secondary | ICD-10-CM | POA: Insufficient documentation

## 2014-02-15 DIAGNOSIS — R110 Nausea: Secondary | ICD-10-CM | POA: Insufficient documentation

## 2014-02-15 DIAGNOSIS — Z8669 Personal history of other diseases of the nervous system and sense organs: Secondary | ICD-10-CM | POA: Insufficient documentation

## 2014-02-15 DIAGNOSIS — R41840 Attention and concentration deficit: Secondary | ICD-10-CM | POA: Insufficient documentation

## 2014-02-15 MED ORDER — PSEUDOEPHEDRINE HCL ER 120 MG PO TB12: 120.0000 mg | ORAL_TABLET | Freq: Two times a day (BID) | ORAL | Status: DC

## 2014-02-15 MED ORDER — TRIAMCINOLONE ACETONIDE 55 MCG/ACT NA AERO: 2.0000 | INHALATION_SPRAY | Freq: Every day | NASAL | Status: DC

## 2014-02-15 NOTE — Discharge Instructions (Signed)
As discussed, your evaluation today has been largely reassuring.  But, it is important that you monitor your condition carefully, and do not hesitate to return to the ED if you develop new, or concerning changes in your condition. ? ?Otherwise, please follow-up with your physician for appropriate ongoing care. ? ?
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
## 2014-08-24 ENCOUNTER — Encounter (HOSPITAL_COMMUNITY): Payer: Self-pay | Admitting: Emergency Medicine

## 2014-08-24 ENCOUNTER — Emergency Department (HOSPITAL_COMMUNITY)
Admission: EM | Admit: 2014-08-24 | Discharge: 2014-08-25 | Disposition: A | Discharge status: Home / Self Care | Payer: Medicaid Other | Attending: Emergency Medicine | Admitting: Emergency Medicine

## 2014-08-24 DIAGNOSIS — Y9389 Activity, other specified: Secondary | ICD-10-CM | POA: Insufficient documentation

## 2014-08-24 DIAGNOSIS — S62115A Nondisplaced fracture of triquetrum [cuneiform] bone, left wrist, initial encounter for closed fracture: Secondary | ICD-10-CM | POA: Diagnosis not present

## 2014-08-24 DIAGNOSIS — Y92410 Unspecified street and highway as the place of occurrence of the external cause: Secondary | ICD-10-CM | POA: Diagnosis not present

## 2014-08-24 DIAGNOSIS — S4990XA Unspecified injury of shoulder and upper arm, unspecified arm, initial encounter: Secondary | ICD-10-CM | POA: Insufficient documentation

## 2014-08-24 DIAGNOSIS — Z720 Tobacco use: Secondary | ICD-10-CM | POA: Diagnosis not present

## 2014-08-24 DIAGNOSIS — Z23 Encounter for immunization: Secondary | ICD-10-CM | POA: Insufficient documentation

## 2014-08-24 DIAGNOSIS — M25512 Pain in left shoulder: Secondary | ICD-10-CM

## 2014-08-24 DIAGNOSIS — S6990XA Unspecified injury of unspecified wrist, hand and finger(s), initial encounter: Secondary | ICD-10-CM | POA: Diagnosis present

## 2014-08-24 DIAGNOSIS — S62102A Fracture of unspecified carpal bone, left wrist, initial encounter for closed fracture: Secondary | ICD-10-CM
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
## 2014-08-25 ENCOUNTER — Emergency Department (HOSPITAL_COMMUNITY): Payer: Medicaid Other

## 2014-08-25 MED ORDER — OXYCODONE-ACETAMINOPHEN 5-325 MG PO TABS: 1.0000 | ORAL_TABLET | Freq: Four times a day (QID) | ORAL | Status: DC | PRN

## 2014-08-25 MED ORDER — TETANUS-DIPHTH-ACELL PERTUSSIS 5-2.5-18.5 LF-MCG/0.5 IM SUSP
0.5000 mL | Freq: Once | INTRAMUSCULAR | Status: AC
  Administered 2014-08-25: 0.5 mL via INTRAMUSCULAR
  Filled 2014-08-25: qty 0.5

## 2014-08-25 MED ORDER — OXYCODONE-ACETAMINOPHEN 5-325 MG PO TABS
2.0000 | ORAL_TABLET | Freq: Once | ORAL | Status: AC
  Administered 2014-08-25: 2 via ORAL
  Filled 2014-08-25: qty 2

## 2014-08-25 MED ORDER — IBUPROFEN 600 MG PO TABS: 600.0000 mg | ORAL_TABLET | Freq: Four times a day (QID) | ORAL | Status: DC | PRN

## 2014-08-25 NOTE — Discharge Instructions (Signed)
Or you have a questionable fracture in the wrist of a very small bone been placed in a splint, operative please wear.  This at all times except while bathing.  You have also been given a referral to Dr. Janee Mornhompson, who is a hand specialist.  Please make an appointment for further evaluation
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
## 2014-08-25 NOTE — ED Provider Notes (Signed)
Medical screening examination/treatment/procedure(s) were performed by non-physician practitioner and as supervising physician I was immediately available for consultation/collaboration.   EKG Interpretation None       Chayil Gantt K Alegandra Sommers-Rasch, MD 08/25/14 [phone]

## 2014-11-14 ENCOUNTER — Encounter (HOSPITAL_COMMUNITY): Payer: Self-pay | Admitting: Cardiology

## 2014-11-14 ENCOUNTER — Emergency Department (HOSPITAL_COMMUNITY)
Admission: EM | Admit: 2014-11-14 | Discharge: 2014-11-16 | Disposition: A | Discharge status: Home / Self Care | Payer: Medicaid Other | Attending: Emergency Medicine | Admitting: Emergency Medicine

## 2014-11-14 DIAGNOSIS — Z720 Tobacco use: Secondary | ICD-10-CM | POA: Diagnosis not present

## 2014-11-14 DIAGNOSIS — Z008 Encounter for other general examination: Secondary | ICD-10-CM | POA: Diagnosis present

## 2014-11-14 DIAGNOSIS — Z79899 Other long term (current) drug therapy: Secondary | ICD-10-CM | POA: Insufficient documentation

## 2014-11-14 DIAGNOSIS — F1010 Alcohol abuse, uncomplicated: Secondary | ICD-10-CM | POA: Diagnosis not present

## 2014-11-14 LAB — COMPREHENSIVE METABOLIC PANEL
ALT: 19 U/L (ref 0–53)
AST: 24 U/L (ref 0–37)
Albumin: 4.4 g/dL (ref 3.5–5.2)
Alkaline Phosphatase: 44 U/L (ref 39–117)
Anion gap: 8 (ref 5–15)
BUN: 11 mg/dL (ref 6–23)
CALCIUM: 9 mg/dL (ref 8.4–10.5)
CO2: 25 mmol/L (ref 19–32)
CREATININE: 1.34 mg/dL (ref 0.50–1.35)
Chloride: 103 mEq/L (ref 96–112)
GFR calc Af Amer: 80 mL/min — ABNORMAL LOW (ref >90)
GFR, EST NON AFRICAN AMERICAN: 69 mL/min — AB (ref >90)
Glucose, Bld: 101 mg/dL — ABNORMAL HIGH (ref 70–99)
Potassium: 4.2 mmol/L (ref 3.5–5.1)
SODIUM: 136 mmol/L (ref 135–145)
TOTAL PROTEIN: 7.3 g/dL (ref 6.0–8.3)
Total Bilirubin: 0.9 mg/dL (ref 0.3–1.2)

## 2014-11-14 LAB — RAPID URINE DRUG SCREEN, HOSP PERFORMED
AMPHETAMINES: NOT DETECTED
Barbiturates: NOT DETECTED
Benzodiazepines: NOT DETECTED
Cocaine: NOT DETECTED
OPIATES: NOT DETECTED
Tetrahydrocannabinol: POSITIVE — AB

## 2014-11-14 LAB — ETHANOL: ALCOHOL ETHYL (B): 9 mg/dL (ref 0–9)

## 2014-11-14 LAB — CBC
HCT: 45.5 % (ref 39.0–52.0)
Hemoglobin: 15.6 g/dL (ref 13.0–17.0)
MCH: 31.7 pg (ref 26.0–34.0)
MCHC: 34.3 g/dL (ref 30.0–36.0)
MCV: 92.5 fL (ref 78.0–100.0)
PLATELETS: 213 10*3/uL (ref 150–400)
RBC: 4.92 MIL/uL (ref 4.22–5.81)
RDW: 13 % (ref 11.5–15.5)
WBC: 5.2 10*3/uL (ref 4.0–10.5)

## 2014-11-14 LAB — SALICYLATE LEVEL

## 2014-11-14 LAB — ACETAMINOPHEN LEVEL: Acetaminophen (Tylenol), Serum: <10 ug/mL — ABNORMAL LOW (ref 10–30)

## 2014-11-14 MED ORDER — ACETAMINOPHEN 325 MG PO TABS: 650.0000 mg | ORAL_TABLET | ORAL | Status: DC | PRN

## 2014-11-14 MED ORDER — ONDANSETRON HCL 4 MG PO TABS
4.0000 mg | ORAL_TABLET | Freq: Three times a day (TID) | ORAL | Status: DC | PRN
Start: 2014-11-14 — End: 2014-11-16
  Administered 2014-11-14: 4 mg via ORAL
  Filled 2014-11-14: qty 1

## 2014-11-14 MED ORDER — IBUPROFEN 400 MG PO TABS: 600.0000 mg | ORAL_TABLET | Freq: Three times a day (TID) | ORAL | Status: DC | PRN

## 2014-11-14 MED ORDER — LORAZEPAM 1 MG PO TABS: 0.0000 mg | ORAL_TABLET | Freq: Two times a day (BID) | ORAL | Status: DC

## 2014-11-14 MED ORDER — LORAZEPAM 1 MG PO TABS
0.0000 mg | ORAL_TABLET | Freq: Four times a day (QID) | ORAL | Status: DC
  Administered 2014-11-14 – 2014-11-16 (×5): 1 mg via ORAL
  Filled 2014-11-14 (×5): qty 1

## 2014-11-14 NOTE — [facility] Counselor (Addendum)
[facility] She sts she hasn't had chance to review referral yet.  Evette Cristalaroline Paige Ediel [place], [place] Assessment Counselor [phone]   [facility]. Writer faxed referral to [facility].  Evette Cristalaroline Paige Willliam P[place], [place] Assessment Counselor [phone]

## 2014-11-14 NOTE — ED Provider Notes (Signed)
CSN: [id number]     Arrival date & time 11/14/14  1341 History   First MD Initiated Contact with Patient 11/14/14 1400     Chief Complaint  Patient presents with  . Medical Clearance    Detox   Leonard [name] is a 33 y.o. male with a history of alcohol abuse who presents the emergency department wanting detox from alcohol. The patient reports he's had seizures previously when withdrawing from alcohol. He also reports nausea without vomiting. The patient reports drinking a pint of whiskey a day for the past several years. He reports blacking out several times per week. The patient would like help to stop drinking alcohol. The patient reports he drank a pint of whiskey yesterday and has had 1 shot of alcohol today to help with his nausea. Patient reports he's been trying to cut back on his drinking for the past 3 days and has had diarrhea for the past 3 days. He reports generalized abdominal pain for the past 6 months.  Patient reports depressed mood for the past several weeks. Patient denies suicidal or homicidal ideations. The patient denies fevers, chills, vomiting, hematochezia, hallucinations, IV drug use, numbness, tingling, weakness, or blurry vision.  (Consider location/radiation/quality/duration/timing/severity/associated sxs/prior Treatment) HPI  Past Medical History  Diagnosis Date  . 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 Alcohol abuse    History reviewed. No pertinent past surgical history. Family History  Problem Relation Age of Onset  . Cancer Other   . Hypertension Other    History  Substance Use Topics  . Smoking status: Current Some Day Smoker -- 0.25 packs/day    Types: Cigarettes  . Smokeless tobacco: Never Used  . Alcohol Use: 42.0 oz/week    70 Shots of liquor per week     Comment: 2 pints cognac weekly, last drink 3/17    Review of Systems  Constitutional: Negative for fever and chills.  HENT: Negative for congestion, ear pain, sore throat and trouble  swallowing.   Eyes: Negative for pain and visual disturbance.  Respiratory: Negative for cough, shortness of breath and wheezing.   Cardiovascular: Negative for chest pain and palpitations.  Gastrointestinal: Positive for nausea, abdominal pain and diarrhea. Negative for vomiting, constipation and blood in stool.  Genitourinary: Negative for dysuria, hematuria and difficulty urinating.  Musculoskeletal: Negative for back pain and neck pain.  Skin: Negative for rash and wound.  Neurological: Negative for dizziness, weakness, light-headedness, numbness and headaches.  Psychiatric/Behavioral: Negative for suicidal ideas, hallucinations and self-injury. The patient is nervous/anxious.   All other systems reviewed and are negative.     Allergies  Review of patient's allergies indicates no known allergies.  Home Medications   Prior to Admission medications   Medication Sig Start Date End Date Taking? Authorizing Provider  cyclobenzaprine (FLEXERIL) 10 MG tablet Take 10 mg by mouth 3 (three) times daily as needed for muscle spasms.   Yes Historical Provider, MD  Multiple Vitamin (MULTIVITAMIN WITH MINERALS) TABS tablet Take 1 tablet by mouth daily.   Yes Historical Provider, MD  ibuprofen (ADVIL,MOTRIN) 600 MG tablet Take 1 tablet (600 mg total) by mouth every 6 (six) hours as needed. Patient not taking: Reported on 11/14/2014 08/25/14   Arman FilterGail K Schulz, NP  oxyCODONE-acetaminophen (PERCOCET/ROXICET) 5-325 MG per tablet Take 1 tablet by mouth every 6 (six) hours as needed for severe pain. Patient not taking: Reported on 11/14/2014 08/25/14   Arman FilterGail K Schulz, NP   BP 130/76 mmHg  Pulse 63  Temp(Src) 98 F (36.7 C) (Oral)  Resp 20  SpO2 98% Physical Exam  Constitutional: He is oriented to person, place, and time. He appears well-developed and well-nourished. No distress.  HENT:  Head: Normocephalic and atraumatic.  Mouth/Throat: Oropharynx is clear and moist. No oropharyngeal exudate.  Eyes:  Conjunctivae and EOM are normal. Pupils are equal, round, and reactive to light. Right eye exhibits no discharge. Left eye exhibits no discharge.  Neck: Neck supple.  Cardiovascular: Normal rate, regular rhythm, normal heart sounds and intact distal pulses.  Exam reveals no gallop and no friction rub.   No murmur heard. Pulmonary/Chest: Effort normal and breath sounds normal. No respiratory distress. He has no wheezes. He has no rales.  Abdominal: Soft. Bowel sounds are normal. He exhibits no distension and no mass. There is no tenderness. There is no rebound and no guarding.  Patient is adamant soft and nontender to palpation. Bowel sounds are present.  Musculoskeletal: He exhibits no edema.  Lymphadenopathy:    He has no cervical adenopathy.  Neurological: He is alert and oriented to person, place, and time. Coordination normal.  Skin: Skin is warm and dry. No rash noted. He is not diaphoretic. No erythema. No pallor.  Psychiatric: His behavior is normal. His mood appears anxious. His speech is not rapid and/or pressured and not slurred. He is not actively hallucinating. Thought content is not paranoid and not delusional. He expresses no homicidal and no suicidal ideation.  Patient appears slightly anxious.   Nursing note and vitals reviewed.   ED Course  Procedures (including critical care time) Labs Review Labs Reviewed  ACETAMINOPHEN LEVEL - Abnormal; Notable for the following:    Acetaminophen (Tylenol), Serum <10.0 (*)    All other components within normal limits  COMPREHENSIVE METABOLIC PANEL - Abnormal; Notable for the following:    Glucose, Bld 101 (*)    GFR calc non Af Amer 69 (*)    GFR calc Af Amer 80 (*)    All other components within normal limits  URINE RAPID DRUG SCREEN (HOSP PERFORMED) - Abnormal; Notable for the following:    Tetrahydrocannabinol POSITIVE (*)    All other components within normal limits  CBC  ETHANOL  SALICYLATE LEVEL    Imaging Review No  results found.   EKG Interpretation None      Filed Vitals:   11/14/14 1345 11/14/14 1621  BP: 125/77 130/76  Pulse: 92 63  Temp: 98 F (36.7 C)   TempSrc: Oral   Resp: 20   SpO2: 98%      MDM   Meds given in ED:  Medications  LORazepam (ATIVAN) tablet 0-4 mg (1 mg Oral Given 11/14/14 1628)    Followed by  LORazepam (ATIVAN) tablet 0-4 mg (not administered)  ondansetron (ZOFRAN) tablet 4 mg (4 mg Oral Given 11/14/14 1620)  acetaminophen (TYLENOL) tablet 650 mg (not administered)  ibuprofen (ADVIL,MOTRIN) tablet 600 mg (not administered)    New Prescriptions   No medications on file    Final diagnoses:  Alcohol abuse   Leonard AngerRobert D [name] is a 33 y.o. male with a history of alcohol abuse who presents the emergency department wanting detox from alcohol. The patient reports he's had seizures previously when withdrawing from alcohol. Patient is afebrile and nontoxic pain. Patient appears slightly anxious. Patient's injection is positive for THC. Patient's CBC and CMP are unremarkable. Patient's alcohol level was 9. Patient is medically cleared to go to alcohol detox. I spoke with Paige from [facility] who  recommends inpatient detox for alcohol with this patient due to his history of seizures. Patient is in agreement with inpatient detox. They advised her looking for placement at this time.   This patient was discussed with Dr. Micheline Maze who agrees with assessment and plan.     Lawana Chambers, PA-C 11/14/14 1610  Toy Cookey, MD 11/14/14 2015

## 2014-11-14 NOTE — ED Notes (Signed)
Pt reports that he wants to Detox from Alcohol. Reports that his last drink was this morning. Denies any drug, SI/HI. Reports treatment in the past.

## 2014-11-14 NOTE — BH Assessment (Addendum)
Tele Assessment Note   Leonard AngerRobert D [name] is an 33 y.o. male. Writer spoke w/ EDP Docherty re: pt's presentation. Pt was polite and cooperative during teleassessment. [facility] detox and treatment. Pt is oriented x 4. He reports he drinks approx 1 pint of whiskey daily and has done so for the past 5 mos. Pt sts he drank one shot of whiskey this am to avoid withdrawal sxs. Pt reports current withdrawal sxs including nausea, diarrhea, HA and chills. Pt reports hx of seizures when he stops drinking alcohol. Pt denies SI and HI. [facility] and no delusions noted. 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 [facility] for [facility]. Pt reports he smokes approx 0.5 grams of THC every other day. Pt reports the last time he smoked THC was yesterday. He states he presents today for detox because "I'm not myself." Pt repots he needs to get sober for his sons. He endorses depressed mood with isolating bx, loss of interest in usual pleasures, tearfulness, irritability, guilt, fatigue, insomnia and poor appetite. Pt's next court date is 12/06/14 for his second DUI. Pt reports recent weight loss of 15 lbs. Pt states he works at [employer]. Writer ran pt by Claudette Headonrad Withrow NP who recommends inpatient treatment. Writer called and updated Everlene FarrierWilliam Dansie PA-C on Withrow's recommendation.   Axis I: Alcohol Use Disorder, Severe           Substance Induced Mood D/O Axis II: Deferred Axis III:  Past Medical History  Diagnosis Date  . 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 Alcohol abuse    Axis IV: other psychosocial or environmental problems, problems related to legal system/crime and problems related to social environment Axis V: 31-40 impairment in reality testing  Past Medical History:  Past Medical History  Diagnosis Date  . 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 Alcohol abuse      History reviewed. No pertinent past surgical history.  Family History:  Family History  Problem Relation Age of Onset  . Cancer Other   . Hypertension Other     Social History:  reports that he has been smoking Cigarettes.  He has been smoking about 0.25 packs per day. He has never used smokeless tobacco. He reports that he drinks about 42.0 oz of alcohol per week. He reports that he does not use illicit drugs.  Additional Social History:  Alcohol / Drug Use Pain Medications: pt denies abuse Prescriptions: pt denies abuse Over the Counter: pt denies abuse History of alcohol / drug use?: Yes Negative Consequences of Use: Personal relationships, Financial, Armed forces operational officerLegal, Work / School Withdrawal Symptoms: Seizures, Nausea / Vomiting (headaches) Onset of Seizures: past few years Date of most recent seizure: recently Substance #1 Name of Substance 1: alcohol 1 - Age of First Use: 17 1 - Amount (size/oz): 1 pint whiskey 1 - Frequency: daily 1 - Duration: daily for past 5 mos 1 - Last Use / Amount: 10/15/14 - 1 shot whiskey    Name of substance 2: THC Age of first use - 13 Amount - 0.5 grams Frequency - every other day Duration: years Last Use: 11/13/14.   CIWA: CIWA-Ar BP: 125/77 mmHg Pulse Rate: 92 COWS:    PATIENT STRENGTHS: (choose at least two) Ability for insight Average or above average intelligence  Allergies: No Known Allergies  Home Medications:  (Not in a hospital admission)  OB/GYN Status:  No LMP for male patient.  General Assessment Data Location of Assessment: [facility] ED Is this a Tele or Face-to-Face Assessment?: Tele Assessment Is this an Initial Assessment or a Re-assessment for this encounter?: Initial Assessment Living Arrangements: Children (3 sons (3,5,7)) Can pt return to current living arrangement?: Yes Admission Status: Voluntary Is patient capable of signing voluntary admission?: Yes Transfer from: Home Referral Source: Self/Family/Friend      [facility] Crisis Care Plan Living Arrangements: Children (3 sons (3,5,7)) Name of Psychiatrist: none Name of Therapist: none  Education Status Is patient currently in school?: No Highest grade of school patient has completed: 72 Name of school: [employer]  Risk to self with the past 6 months Suicidal Ideation: No Suicidal Intent: No Is patient at risk for suicide?: No Suicidal Plan?: No Access to Means: No What has been your use of drugs/alcohol within the last 12 months?: daily alcohol use - 1 pint Previous Attempts/Gestures: No How many times?: 0 Other Self Harm Risks: none Triggers for Past Attempts:  (n/a) Intentional Self Injurious Behavior: None Family Suicide History: No Recent stressful life event(s): Other (Comment), Loss (Comment) (two DWIs, girlfriend break up w/ him) Persecutory voices/beliefs?: No Depression: Yes Depression Symptoms: Insomnia, Tearfulness, Isolating, Fatigue, Guilt, Loss of interest in usual pleasures, Feeling worthless/self pity, Feeling angry/irritable Substance abuse history and/or treatment for substance abuse?: No Suicide prevention information given to non-admitted patients: Not applicable  Risk to Others within the past 6 months Homicidal Ideation: No Thoughts of Harm to Others: No Current Homicidal Intent: No Current Homicidal Plan: No Access to Homicidal Means: No Identified Victim: none History of harm to others?: No Assessment of Violence: None Noted Violent Behavior Description: pt denies hx violence - pt is polite  Does patient have access to weapons?: No Criminal Charges Pending?: No Does patient have a court date: Yes Court Date: 12/06/14  Psychosis Hallucinations: None noted Delusions: None noted  Mental Status Report Appear/Hygiene: In scrubs, Unremarkable Eye Contact: Good Motor Activity: Freedom of movement Speech: Logical/coherent Level of Consciousness: Alert Mood: Depressed, Anhedonia, Sad Affect:  Appropriate to circumstance, Depressed, Sad Anxiety Level: Minimal Thought Processes: Coherent, Relevant Judgement: Unimpaired Orientation: Person, Place, Time, Situation Obsessive Compulsive Thoughts/Behaviors: None  Cognitive Functioning Concentration: Normal Memory: Remote Intact, Recent Intact IQ: Average Insight: Good Impulse Control: Fair Appetite: Poor Weight Loss: 15 Sleep: Decreased Total Hours of Sleep: 5 Vegetative Symptoms: None  ADLScreening [facility] Assessment Services) Patient's cognitive ability adequate to safely complete daily activities?: Yes Patient able to express need for assistance with ADLs?: Yes Independently performs ADLs?: Yes (appropriate for developmental age)  Prior Inpatient Therapy Prior Inpatient Therapy: Yes Prior Therapy Dates: 2010 Prior Therapy Facilty/Provider(s): [facility] Reason for Treatment: alcohol abuse  Prior Outpatient Therapy Prior Outpatient Therapy: No Prior Therapy Dates: n/a Prior Therapy Facilty/Provider(s): n/a Reason for Treatment: n/a  ADL Screening (condition at time of admission) Patient's cognitive ability adequate to safely complete daily activities?: Yes Is the patient deaf or have difficulty hearing?: No Does the patient have difficulty seeing, even when wearing glasses/contacts?: No Does the patient have difficulty concentrating, remembering, or making decisions?: No Patient able to express need for assistance with ADLs?: Yes Does the patient have difficulty dressing or bathing?: No Independently performs ADLs?: Yes (appropriate for developmental age) Does the patient have difficulty walking or climbing stairs?: No Weakness of Legs: None Weakness of Arms/Hands: None  Home Assistive Devices/Equipment Home Assistive Devices/Equipment:  None    Abuse/Neglect Assessment (Assessment to be complete while patient is alone) Physical Abuse: Denies Verbal Abuse: Denies Sexual Abuse: Denies Exploitation of  patient/patient's resources: Denies Self-Neglect: Denies     Merchant navy officerAdvance Directives (For Healthcare) Does patient have an advance directive?: No Would patient like information on creating an advanced directive?: No - patient declined information    Additional Information 1:1 In Past 12 Months?: No CIRT Risk: No Elopement Risk: No Does patient have medical clearance?: Yes     Disposition:  Disposition Initial Assessment Completed for this Encounter: Yes Disposition of Patient: Inpatient treatment program (conrad withrow NP recommends inpatient treatment) Type of inpatient treatment program: Adult  Thornell SartoriusMCLEAN, Rosmarie Esquibel P 11/14/2014 3:55 PM

## 2014-11-14 NOTE — ED Notes (Signed)
Dinner tray ordered.

## 2014-11-14 NOTE — BHH Counselor (Signed)
TTS Counselor [facility] to inquire about status of pt's possible placement. No one was available in Admissions, but TTS Counselor left callback information. TTS Counselor was told that we would receive a call back from Admissions in the next few hours.   TTS Counselor also faxed clinicals to the following facilities for consideration of inpatient placement:  [facility]     Cyndie MullAnna Memori Sammon, [facility] Triage Specialist [facility]

## 2014-11-15 NOTE — BH Assessment (Signed)
Contacted the following facilities for placement:  Under Review at: [facility]- per [facility]   No Response: [facility]   Declined: [facility] per Lamont DowdyJessica     Laruth Hanger Pickett Jr. MSW, LCSW Therapeutic Triage Services-Triage Specialist   Phone: [phone] Fax: [phone]

## 2014-11-15 NOTE — ED Notes (Signed)
Pt given meal.  Pt continued to sleep and did not want food yet.

## 2014-11-15 NOTE — BH Assessment (Signed)
[facility] as they do not do detox at that facility.   Clista BernhardtNancy Alexavier Tsutsui, [facility] Triage Specialist 11/15/2014 5:12 AM

## 2014-11-15 NOTE — ED Notes (Signed)
This RN ordered a regular, no sharps breakfast tray for the pt.

## 2014-11-15 NOTE — ED Provider Notes (Signed)
7:10 p.m. patient alert Glasgow Coma Score 15 resting comfortably. Denies complaint  Doug SouSam Julee Stoll, MD 11/15/14 [id number]

## 2014-11-15 NOTE — BH Assessment (Signed)
Patient re-assess on this date:  Patient presents orientated x4, mood reported as "nervous" and affect congruent with mood, normal speech in rate and tone, coherent and logical thought process.  Patient denied SI, HI, AH, and VH.  Patient reports slight withdrawal symptoms from, ETOH.  Patient reports feeling "disappointed" in reference to being hospitalized on Christmas Day, however "I know I need inpatient treatment for drinking."  Patient reports receiving 6 hours of sleep the previous night and reports sleep difficulty because of being in an unfamilar place and night vital checks.  He reports his appetite is fair.

## 2014-11-15 NOTE — ED Notes (Signed)
[facility] at bedside. 

## 2014-11-16 MED ORDER — ADULT MULTIVITAMIN W/MINERALS CH
1.0000 | ORAL_TABLET | Freq: Every day | ORAL | Status: DC
  Administered 2014-11-16: 1 via ORAL
  Filled 2014-11-16: qty 1

## 2014-11-16 NOTE — ED Provider Notes (Signed)
Pt accepted to [facility]. Will d/c stable.   Samuel JesterKathleen Nishawn Rotan, DO 11/16/14 [phone]

## 2014-11-16 NOTE — Discharge Instructions (Signed)
°Emergency Department Resource Guide °1) Find a Doctor and Pay Out of Pocket °Although you won't have to find out who is covered by your insurance plan, it is a good idea to ask around and get recommendations. You will then need to call the office and see if the doctor you have chosen will accept you as a new patient and what types of options they offer for patients who are self-pay. Some doctors offer discounts or will set up payment plans for their patients who do not have insurance, but you will need to ask so you aren't surprised when you get to your appointment. ° °2) Contact Your Local Health Department °Not all health departments have doctors that can see patients for sick visits, but many do, so it is worth a call to see if yours does. If you don't know where your local health department is, you can check in your phone book. The CDC also has a tool to help you locate your state's health department, and many state websites also have listings of all of their local health departments. ° °3) Find a Walk-in Clinic °If your illness is not likely to be very severe or complicated, you may want to try a walk in clinic. These are popping up all over the country in pharmacies, drugstores, and shopping centers. They're usually staffed by nurse practitioners or physician assistants that have been trained to treat common illnesses and complaints. They're usually fairly quick and inexpensive. However, if you have serious medical issues or chronic medical problems, these are probably not your best option. ° °No Primary Care Doctor: °- Call [facility] Connect at  832-8000 - they can help you locate a primary care doctor that  accepts your insurance, provides certain services, etc. °- Physician Referral Service- 1-800-533-3463 ° °Chronic Pain Problems: °Organization         Address  Phone   Notes  °[facility]  (336) 297-2271 Patients need to be referred by their primary care doctor.  ° °Medication  Assistance: °Organization         Address  Phone   Notes  °[place] [facility] [address] °[place], [place] [address] (336) 641-8030 --Must be a resident of [place] °-- Must have NO insurance coverage whatsoever (no Medicaid/ Medicare, etc.) °-- The pt. MUST have a primary care doctor that directs their care regularly and follows them in the community °  °[facility]  (866) 331-1348   °[employer]  (888) 892-1162   ° °Agencies that provide inexpensive medical care: °Organization         Address  Phone   Notes  °[facility]  (336) 832-8035   °[facility]    (336) 832-7272   °[facility] [address] °[place], [place] [address] (336) 832-4777   °[facility] [address], °[place] (336) 271-4999   °[facility]    (336) 373-0678   °[facility]    (336) 272-1050   °[facility] ° [address], [place] Phone:  (336) 832-4444, Fax:  (336) 832-4440 Hours of Operation:  9 am - 6 pm, M-F.  Also accepts Medicaid/Medicare and self-pay.  °[facility] ° [address], [place] Phone: (336) 832-3150, Fax: (336) [phone]. Hours of Operation:  8:30 am - 5:30 pm, M-F.  Also accepts Medicaid and self-pay.  °[facility] [place] [address], [place] Phone: (336) 878-6027   °[facility] [address], [place], [place] (336)723-1848, Ext. 123 Mondays & Thursdays: 7-9 AM.  First 15 patients are seen on a first come, first serve basis. °  ° °Medicaid-accepting [place] Providers: ° °Organization         Address  Phone   Notes  °[facility] [address], [place] (336) 641-2100 Also accepts self-pay patients.  °[facility] [address], [place] ° (336) 856-9996   °[facility] [address], [place]  (336) 288-8857   °[facility] [address] [place] [address], [place] (336) 299-7000   °Veita Bland [address], [place]  ° (336) 373-1557 Only accepts [place] Access Medicaid patients after they have their name applied to their card.  ° °Self-Pay (no insurance) in [place]: ° °Organization         Address  Phone   Notes  °Sickle Cell Patients, [facility] [address], [place] (336) 832-1970   °[facility] [address], [place] (336) 832-4400   °[facility] ° [address], [place] (336) 992-4800   °[facility]/Dr. Osei-Bonsu ° [address] [place] [address], [place] or [address], [place] (336) 841-8500 Phone number for both [place] and [place] locations is the same.  °[facility] [address], [place] (336) 299-0000   °[facility] [address] [place] [address], [place] or [address] (336) 852-7530 °(336) 878-2260   °[facility] [address], [place] (336) 350-1642, phone; (336) 294-5005, fax Sees patients 1st and 3rd Saturday of every month.  Must not qualify for public or private insurance (i.e. Medicaid, Medicare, [place] Health Choice, Veterans' Benefits) • Household income should be no more than 200% of the poverty level •The clinic cannot treat you if you are pregnant or think you are pregnant • Sexually transmitted diseases are not treated at the clinic.  ° ° °Dental Care: °Organization         Address  Phone  Notes  °[place] [facility] [address], [place] (336) 641-6152 Accepts children up to age 21 who are enrolled in Medicaid or [place] Health Choice; pregnant women with a Medicaid card; and children who have applied for Medicaid or [place] Health Choice, but were declined, whose parents can pay a reduced fee at time of service.  °[place]  [facility] [place]  [address], [place] (336) 641-7733 Accepts children up to age 21 who are enrolled in Medicaid or [place] Health Choice; pregnant women with a Medicaid card; and children who have applied for Medicaid or [place] Health Choice, but were declined, whose parents can pay a reduced fee at time of service.  °[facility] ° [address], [place] (336) 641-4533 Patients are seen by appointment only. Walk-ins are not accepted. [facility] will see patients 18 years of age and older. °Monday - Tuesday (8am-5pm) °Most Wednesdays (8:30-5pm) °$30 per visit, cash only  °[facility] ° [address], [place] (336) 641-4533 Patients are seen by appointment only. Walk-ins are not accepted. [facility] will see patients 18 years of age and older. °One   Wednesday Evening (Monthly: Volunteer Based).  $30 per visit, cash only  °[facility]  (919) 537-3737 for adults; Children under age 4, call Graduate Pediatric Dentistry at (919) [phone]. Children aged 4-14, please call (919) [phone] to request a pediatric application. ° Dental services are provided in all areas of dental care including fillings, crowns and bridges, complete and partial dentures, implants, gum treatment, root canals, and extractions. Preventive care is also provided. Treatment is provided to both adults and children. °Patients are selected via a lottery and there is often a waiting list. °  °[facility] [address], °[place] ° (336) 763-8833 [email or web address] °  °[facility] [address], [place], [place] (336)723-1848, Ext. 123 Second and Fourth Thursday of each month, opens at 6:30 AM; Clinic ends at 9 AM.  Patients are seen on a first-come first-served basis, and a limited number are seen during each clinic.  ° °[facility] ° [address], [place], [place] (336) 723-7904    Eligibility Requirements °You must have lived in [place], [place], or [place] counties for at least the last three months. °  You cannot be eligible for state or federal sponsored healthcare insurance, including Veterans Administration, Medicaid, or Medicare. °  You generally cannot be eligible for healthcare insurance through your employer.  °  How to apply: °Eligibility screenings are held every Tuesday and Wednesday afternoon from 1:00 pm until 4:00 pm. You do not need an appointment for the interview!  °[facility] [address], [place], [place] 336-631-2330   °[facility]  336-342-8273   °[facility]  336-703-3100   °[facility]  336-570-6415   ° °Behavioral Health Resources in the Community: °Intensive Outpatient Programs °Organization         Address  Phone  Notes  °[facility] [address], [place], [place] 336-878-6098   °[facility] [address], [place], [place] 336-832-9800   °[facility] [address], [place], [place] ° 336-882-2125   °[facility] [address],  °[place], [place] 1-800-853-5163 or 336-641-4981   °Substance Abuse Resources °Organization         Address  Phone  Notes  °[facility]  336-882-2125   °[facility]  336-784-9470   °[facility]  336-285-9073   °[facility]  336-845-3988   °Residential & Outpatient Substance Abuse Program  1-800-659-3381   °Psychological Services °Organization         Address  Phone  Notes  °[facility]  336- 832-9600   °[facility]  336- 378-7881   °[facility] [address], [place] 1-800-853-5163 or 336-641-4981   ° °Mobile Crisis Teams °Organization         Address  Phone  Notes  °[facility], Mobile Crisis Care Unit  1-877-626-1772   °Assertive °[facility] ° [address]  [place], [place] 336-834-9664   °Sharon DeEsch [address] °[place] 336-554-5454   ° °Self-Help/Support Groups °Organization         Address  Phone             Notes  °[facility] of  - variety of support groups  336- 373-1402 Call for more information  °Narcotics Anonymous (NA), [facility] [address], °[place]  2 meetings at this location  ° °  Residential Treatment Programs Organization         Address  Phone  Notes  [facility] [address],    [place]  [phone]   [facility]  [address], [place] [id number], [place], [place] [phone]   [facility] [address], [place] [phone] Admissions: 8am-3pm M-F  [facility] [address]    [place], [place] [phone] [phone]   [facility] [address], [place], [place] [phone]   [facility] [address]  [place], [place] [phone]   [facility] Intensive Outpatient [address], [place], [place] [phone]   [facility] ([facility] [address]  [place], [place] [phone] or [phone]   [facility] ([facility]) [address] [place], [place] [phone] Accepts Medicaid  [facility] [address]  [place] [phone] Substance Abuse/Addiction Treatment   [facility] Organization         Address  Phone  Notes  [facility]  [phone](888) ([phone]) [phone]   Angie FavaJulie Brannon, PhD [address] [place], [place]   [phone](336) [phone] or [phone](336) [phone]   [facility]   [address] [place], [place] [phone](336) [phone]   [facility] [address], [place], [place] [phone](336) [phone] Insurance/Medicaid/sponsorship through [facility]  [facility] [address]                                    [place], [place] [phone](336) [phone] Therapy/tele-psych/case    [facility] [address]   [place], [place] [phone](336) [phone]    Dr. Lolly MustacheArfeen  [phone](336) [phone]   [facility]  [employer] [facility] 1) [address], [place] 2) [address], [place] 3)  [address], [place] [phone]) [phone](336) [phone](336) [phone]  [phone](336) [phone]   [facility] Child Abuse Hotline [phone](336) (831)[phone] or 4357865891(336) [phone] (After Hours)      Go directly to [facility] after your discharge from the Emergency Department.

## 2014-11-16 NOTE — BHH Counselor (Signed)
11-16-2014, 0935:  [facility] declined because of high acuity.  [facility] reports review is still pending call back at 1300.

## 2014-11-16 NOTE — ED Notes (Signed)
PT'S SPOUSE CALLED TO ADVISE SHE WILL BE IN TO VISIT AT THE 1730 VISITING TIME.

## 2014-11-16 NOTE — ED Notes (Signed)
[facility] ADVISED WILL BE ARRIVING IN APPROX 10 MINUTES.

## 2014-11-16 NOTE — BH Assessment (Signed)
The Patient was reassessed on this date.  The Patient was orientated x4, self reports feeling "sleepy" and "irritatable" and his affect was congruent.  The Patient denied SI, HI, AH, and VH.  He reports not having an appetite and did not eat breakfast or lunch.  The Patient denied experiencing current physical withdrawal symptoms from alcohol, however he reports experiencing cravings.  The Patient reports feeling unsure if he wants to continue to remain in the hospital, since he is currently not receiving any treatment "for my drinking."  The Patient feels "I could be at home making money instead of sitting around here."

## 2014-11-16 NOTE — Progress Notes (Signed)
CSW met with patient and patient's spouse.  [facility].  Patient's spouse is going to get clothes and belongings for patient and then patient will be ready for transfer to [facility].  Patient wants to complete detox and then continue with treatment at [facility].  CSW signing off.  [facility] Chazlyn Cude Richardo Priest ED CSW [phone]

## 2014-11-16 NOTE — Progress Notes (Signed)
CSW sent referral for patient to [facility] for alcohol detox and patient was accepted.  CSW met with patient to let him know he has been accepted to [facility] for [facility] detox.  Patient was fatigued, "I just woke up for a dead sleep, give me an hour.  I will talk to my wife.  I would like to stay in the city, but I want to get on with this thing. "  Patient continues to deny any ideation, is fatigued and agitated.  CSW used MI to increase motivation for treatment.  Plan:  1. CSW will wait till patient wakes and then discuss with patient and wife whether he would like to pursue inpatient or outpatient SA treatment. 2. If inpatient patient has a bed at [facility] and spouse will be asked to bring patient's belongings so he can be transported via [place].  If outpatient patient's spouse will be asked to pick patient up when discharge is completed and outpatient SA referrals will be given.

## 2017-03-27 ENCOUNTER — Emergency Department (HOSPITAL_COMMUNITY)
Admission: EM | Admit: 2017-03-27 | Discharge: 2017-03-28 | Disposition: A | Discharge status: Home / Self Care | Payer: Medicaid Other | Attending: Emergency Medicine | Admitting: Emergency Medicine

## 2017-03-27 ENCOUNTER — Emergency Department (HOSPITAL_COMMUNITY): Payer: Medicaid Other

## 2017-03-27 ENCOUNTER — Encounter (HOSPITAL_COMMUNITY): Payer: Self-pay

## 2017-03-27 DIAGNOSIS — F17210 Nicotine dependence, cigarettes, uncomplicated: Secondary | ICD-10-CM | POA: Insufficient documentation

## 2017-03-27 DIAGNOSIS — M25512 Pain in left shoulder: Secondary | ICD-10-CM | POA: Insufficient documentation

## 2017-03-27 DIAGNOSIS — Z79899 Other long term (current) drug therapy: Secondary | ICD-10-CM | POA: Insufficient documentation

## 2017-03-27 MED ORDER — KETOROLAC TROMETHAMINE 60 MG/2ML IM SOLN
30.0000 mg | Freq: Once | INTRAMUSCULAR | Status: AC
  Administered 2017-03-28: 30 mg via INTRAMUSCULAR
  Filled 2017-03-27: qty 2

## 2017-03-27 MED ORDER — DICLOFENAC SODIUM 50 MG PO TBEC: 50.0000 mg | DELAYED_RELEASE_TABLET | Freq: Two times a day (BID) | ORAL | 0 refills | Status: DC

## 2017-03-27 MED ORDER — CYCLOBENZAPRINE HCL 10 MG PO TABS
10.0000 mg | ORAL_TABLET | Freq: Once | ORAL | Status: AC
  Administered 2017-03-28: 10 mg via ORAL
  Filled 2017-03-27: qty 1

## 2017-03-27 MED ORDER — HYDROCODONE-ACETAMINOPHEN 5-325 MG PO TABS
1.0000 | ORAL_TABLET | Freq: Once | ORAL | Status: AC
  Administered 2017-03-28: 1 via ORAL
  Filled 2017-03-27: qty 1

## 2017-03-27 MED ORDER — METHOCARBAMOL 500 MG PO TABS: 500.0000 mg | ORAL_TABLET | Freq: Two times a day (BID) | ORAL | 0 refills | Status: DC

## 2017-03-27 NOTE — ED Triage Notes (Signed)
Pt complaining of L shoulder pain. Pt states pain radiates to hand, occasional hand numbness. Pt with full ROM, obvious muscle difference between left and right pectoralis. Pt states fell on left shoulder in November.

## 2017-03-27 NOTE — ED Provider Notes (Signed)
[facility]EM[facility] DEPT Provider Note   CSN: [id number] Arrival date & time: 03/27/17  2159  By signing my name below, I, Leonard [name], attest that this documentation has been prepared under the direction and in the presence of non-physician practitioner, Kerrie BuffaloHope Aqib Lough, NP. Electronically Signed: Nelwyn SalisburyJoshua [name], Scribe. 03/27/2017. 11:20 PM.  History   Chief Complaint Chief Complaint  Patient presents with  . Shoulder Pain   The history is provided by the patient. No language interpreter was used.  Shoulder Pain   This is a chronic problem. The current episode started more than 1 week ago. The problem occurs constantly. The problem has been gradually worsening. The pain is present in the left shoulder. The quality of the pain is described as aching. The pain is mild. Associated symptoms include numbness, stiffness and tingling. He has tried cold, OTC pain medications and heat for the symptoms. The treatment provided no relief.    HPI Comments:  Leonard [name] is a 36 y.o. male who presents to the Emergency Department complaining of constant, worsening, mild left shoulder pain onset 6 months. He describes his pain as beginning in his posterior neck, radiating into his left shoulder and down his left arm. Pt reports associated weakness, numbness and paraesthesia to his left fingertips. He notes that his pectoralis muscle on his left side has atrophied in the time since his injury. He haes tried epsom salts, OTC pain medication, heat and ice with no relief. Pt comes to the ED today because his pain has worsened to the point that he has difficulty sleeping. Denies any fever.  Past Medical History:  Diagnosis Date  . Alcohol abuse   . 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   History reviewed. No pertinent surgical history.     Home Medications    Prior to Admission medications   Medication Sig Start Date End Date Taking? Authorizing  Provider  diclofenac (VOLTAREN) 50 MG EC tablet Take 1 tablet (50 mg total) by mouth 2 (two) times daily. 03/27/17   Janne NapoleonNeese, Keaghan Bowens M, NP  methocarbamol (ROBAXIN) 500 MG tablet Take 1 tablet (500 mg total) by mouth 2 (two) times daily. 03/27/17   Janne NapoleonNeese, Shjon Lizarraga M, NP  Multiple Vitamin (MULTIVITAMIN WITH MINERALS) TABS tablet Take 1 tablet by mouth daily.    [provider]    Family History Family History  Problem Relation Age of Onset  . Cancer Other   . Hypertension Other     Social History Social History  Substance Use Topics  . Smoking status: Current Some Day Smoker    Packs/day: 0.25    Types: Cigarettes  . Smokeless tobacco: Never Used  . Alcohol use 42.0 oz/week    70 Shots of liquor per week     Comment: 2 pints cognac weekly, last drink 3/17     Allergies   Patient has no known allergies.   Review of Systems Review of Systems  Musculoskeletal: Positive for arthralgias and stiffness.  Neurological: Positive for tingling, weakness and numbness.       Positive for paraesthesia   All other systems reviewed and are negative.    Physical Exam Updated Vital Signs BP (!) 140/100 (BP Location: Right Arm)   Pulse 71   Temp 98.2 F (36.8 C) (Oral)   Resp (!) 22   Ht 6\' 1"  (1.854 m)   Wt 88.5 kg   SpO2 100%   BMI 25.73  kg/m   Physical Exam  Constitutional: He is oriented to person, place, and time. He appears well-developed and well-nourished. No distress.  HENT:  Head: Normocephalic and atraumatic.  Eyes: Conjunctivae are normal.  Cardiovascular: Normal rate.   Pulmonary/Chest: Effort normal.  Abdominal: He exhibits no distension.  Musculoskeletal: He exhibits tenderness.  Muscle spasm to posterior aspect of the left shoulder. Full ROM. Radial pulses 2+, adequate circulation. Grips equal.   Neurological: He is alert and oriented to person, place, and time.  Skin: Skin is warm and dry.  Psychiatric: He has a normal mood and affect.  Nursing note and  vitals reviewed.    ED Treatments / Results  DIAGNOSTIC STUDIES:  Oxygen Saturation is 100% on RA, normal by my interpretation.    COORDINATION OF CARE:  11:29 PM Discussed treatment plan with pt at bedside. Discussed that since this problem has been ongoing x 6 months that he will need ortho referral. Will treat symptoms at this time with muscle relaxants. pt agreed to plan. Pt questioned on getting an MRI tonight, I discussed with the pt that he will need to f/u with the orthopedic doctor for further evaluation and at that time if the doctor feels an MRI is indicated he will order the imaging.   Labs (all labs ordered are listed, but only abnormal results are displayed)   Radiology Dg Shoulder Left  Result Date: 03/27/2017 CLINICAL DATA:  LEFT shoulder pain radiating to hand with numbness and tingling. Fell in November with persistent pain. EXAM: LEFT SHOULDER - 2+ VIEW COMPARISON:  LEFT shoulder radiographs August 25, 2014 FINDINGS: The humeral head is well-formed and located. Os acromiale. Unchanged subcentimeter corticated bony fragment projecting posterior to the scapula. The subacromial, glenohumeral and acromioclavicular joint spaces are intact. No destructive bony lesions. Soft tissue planes are non-suspicious. IMPRESSION: Stable examination: No acute osseous process. Electronically Signed   By: Awilda Metro M.D.   On: 03/27/2017 23:25    Procedures Procedures (including critical care time)  Medications Ordered in ED Medications  cyclobenzaprine (FLEXERIL) tablet 10 mg (10 mg Oral Given 03/28/17 0000)  ketorolac (TORADOL) injection 30 mg (30 mg Intramuscular Given 03/28/17 0001)  HYDROcodone-acetaminophen (NORCO/VICODIN) 5-325 MG per tablet 1 tablet (1 tablet Oral Given 03/28/17 0001)     Initial Impression / Assessment and Plan / ED Course  I have reviewed the triage vital signs and the nursing notes. Patient X-Ray negative for obvious fracture or dislocation.  Pt advised  to follow up with orthopedics. conservative therapy recommended and discussed. Patient will be discharged home & is agreeable with above plan. Returns precautions discussed. Pt appears safe for discharge.  Final Clinical Impressions(s) / ED Diagnoses   Final diagnoses:  Left shoulder pain, unspecified chronicity    New Prescriptions Discharge Medication List as of 03/27/2017 11:40 PM    START taking these medications   Details  diclofenac (VOLTAREN) 50 MG EC tablet Take 1 tablet (50 mg total) by mouth 2 (two) times daily., Starting Sun 03/27/2017, Print    methocarbamol (ROBAXIN) 500 MG tablet Take 1 tablet (500 mg total) by mouth 2 (two) times daily., Starting Sun 03/27/2017, Print       I personally performed the services described in this documentation, which was scribed in my presence. The recorded information has been reviewed and is accurate.     Kerrie Buffalo [place], [place] 03/28/17 0113    Rolland Porter, MD 04/08/17 [phone]

## 2017-03-27 NOTE — Discharge Instructions (Signed)
Call Dr. Aundria Rudogers' office for follow up. Do not take the muscle relaxant while driving as it will make you sleepy.

## 2017-04-03 ENCOUNTER — Ambulatory Visit (HOSPITAL_COMMUNITY)
Admission: AD | Admit: 2017-04-03 | Discharge: 2017-04-03 | Disposition: A | Discharge status: Home / Self Care | Payer: Self-pay | Attending: Psychiatry | Admitting: Psychiatry

## 2017-04-03 DIAGNOSIS — F329 Major depressive disorder, single episode, unspecified: Secondary | ICD-10-CM | POA: Insufficient documentation

## 2017-04-04 ENCOUNTER — Encounter (HOSPITAL_COMMUNITY): Payer: Self-pay

## 2017-04-04 ENCOUNTER — Emergency Department (HOSPITAL_COMMUNITY)
Admission: EM | Admit: 2017-04-04 | Discharge: 2017-04-04 | Disposition: A | Discharge status: Home / Self Care | Payer: Medicaid Other | Attending: Emergency Medicine | Admitting: Emergency Medicine

## 2017-04-04 ENCOUNTER — Inpatient Hospital Stay (HOSPITAL_COMMUNITY)
Admission: AD | Admit: 2017-04-04 | Discharge: 2017-04-08 | DRG: 881 | Disposition: A | Discharge status: Home / Self Care | Payer: No Typology Code available for payment source | Source: Intra-hospital | Attending: Psychiatry | Admitting: Psychiatry

## 2017-04-04 DIAGNOSIS — Z79899 Other long term (current) drug therapy: Secondary | ICD-10-CM | POA: Insufficient documentation

## 2017-04-04 DIAGNOSIS — F1220 Cannabis dependence, uncomplicated: Secondary | ICD-10-CM | POA: Diagnosis present

## 2017-04-04 DIAGNOSIS — F1020 Alcohol dependence, uncomplicated: Secondary | ICD-10-CM | POA: Diagnosis present

## 2017-04-04 DIAGNOSIS — F32A Depression, unspecified: Secondary | ICD-10-CM

## 2017-04-04 DIAGNOSIS — F322 Major depressive disorder, single episode, severe without psychotic features: Secondary | ICD-10-CM | POA: Diagnosis not present

## 2017-04-04 DIAGNOSIS — F329 Major depressive disorder, single episode, unspecified: Principal | ICD-10-CM | POA: Diagnosis present

## 2017-04-04 DIAGNOSIS — F1410 Cocaine abuse, uncomplicated: Secondary | ICD-10-CM | POA: Insufficient documentation

## 2017-04-04 DIAGNOSIS — F17210 Nicotine dependence, cigarettes, uncomplicated: Secondary | ICD-10-CM | POA: Diagnosis present

## 2017-04-04 DIAGNOSIS — G4700 Insomnia, unspecified: Secondary | ICD-10-CM | POA: Diagnosis present

## 2017-04-04 DIAGNOSIS — F1210 Cannabis abuse, uncomplicated: Secondary | ICD-10-CM | POA: Insufficient documentation

## 2017-04-04 DIAGNOSIS — Y906 Blood alcohol level of 120-199 mg/100 ml: Secondary | ICD-10-CM | POA: Diagnosis present

## 2017-04-04 DIAGNOSIS — F1910 Other psychoactive substance abuse, uncomplicated: Secondary | ICD-10-CM

## 2017-04-04 LAB — COMPREHENSIVE METABOLIC PANEL
ALT: 26 U/L (ref 17–63)
AST: 33 U/L (ref 15–41)
Albumin: 4.4 g/dL (ref 3.5–5.0)
Alkaline Phosphatase: 72 U/L (ref 38–126)
Anion gap: 12 (ref 5–15)
BUN: 12 mg/dL (ref 6–20)
CHLORIDE: 103 mmol/L (ref 101–111)
CO2: 22 mmol/L (ref 22–32)
Calcium: 8.8 mg/dL — ABNORMAL LOW (ref 8.9–10.3)
Creatinine, Ser: 1.27 mg/dL — ABNORMAL HIGH (ref 0.61–1.24)
GFR calc Af Amer: >60 mL/min (ref >60)
GFR calc non Af Amer: >60 mL/min (ref >60)
Glucose, Bld: 96 mg/dL (ref 65–99)
Potassium: 3.7 mmol/L (ref 3.5–5.1)
Sodium: 137 mmol/L (ref 135–145)
Total Bilirubin: 0.3 mg/dL (ref 0.3–1.2)
Total Protein: 7.3 g/dL (ref 6.5–8.1)

## 2017-04-04 LAB — RAPID URINE DRUG SCREEN, HOSP PERFORMED
Amphetamines: NOT DETECTED
Barbiturates: NOT DETECTED
Benzodiazepines: NOT DETECTED
Cocaine: POSITIVE — AB
Opiates: NOT DETECTED
Tetrahydrocannabinol: POSITIVE — AB

## 2017-04-04 LAB — CBC
HCT: 45.7 % (ref 39.0–52.0)
Hemoglobin: 15.6 g/dL (ref 13.0–17.0)
MCH: 32.6 pg (ref 26.0–34.0)
MCHC: 34.1 g/dL (ref 30.0–36.0)
MCV: 95.6 fL (ref 78.0–100.0)
PLATELETS: 252 10*3/uL (ref 150–400)
RBC: 4.78 MIL/uL (ref 4.22–5.81)
RDW: 13.3 % (ref 11.5–15.5)
WBC: 7.2 10*3/uL (ref 4.0–10.5)

## 2017-04-04 LAB — ETHANOL: Alcohol, Ethyl (B): 148 mg/dL — ABNORMAL HIGH (ref <5)

## 2017-04-04 MED ORDER — VITAMIN B-1 100 MG PO TABS
100.0000 mg | ORAL_TABLET | Freq: Every day | ORAL | Status: DC
  Filled 2017-04-04: qty 1

## 2017-04-04 MED ORDER — METHOCARBAMOL 500 MG PO TABS
500.0000 mg | ORAL_TABLET | Freq: Two times a day (BID) | ORAL | Status: DC
  Administered 2017-04-04: 500 mg via ORAL
  Filled 2017-04-04: qty 1

## 2017-04-04 MED ORDER — ONDANSETRON 4 MG PO TBDP: 4.0000 mg | ORAL_TABLET | Freq: Four times a day (QID) | ORAL | Status: DC | PRN

## 2017-04-04 MED ORDER — THIAMINE HCL 100 MG/ML IJ SOLN: 100.0000 mg | Freq: Every day | INTRAMUSCULAR | Status: DC

## 2017-04-04 MED ORDER — VITAMIN B-1 100 MG PO TABS
100.0000 mg | ORAL_TABLET | Freq: Every day | ORAL | Status: DC
  Administered 2017-04-05: 100 mg via ORAL
  Filled 2017-04-04 (×2): qty 1

## 2017-04-04 MED ORDER — ONDANSETRON HCL 4 MG PO TABS: 4.0000 mg | ORAL_TABLET | Freq: Three times a day (TID) | ORAL | Status: DC | PRN

## 2017-04-04 MED ORDER — LORAZEPAM 1 MG PO TABS: 1.0000 mg | ORAL_TABLET | Freq: Four times a day (QID) | ORAL | Status: DC | PRN

## 2017-04-04 MED ORDER — NICOTINE 7 MG/24HR TD PT24
7.0000 mg | MEDICATED_PATCH | Freq: Every day | TRANSDERMAL | Status: DC
  Administered 2017-04-04: 7 mg via TRANSDERMAL
  Filled 2017-04-04 (×2): qty 1

## 2017-04-04 MED ORDER — ACETAMINOPHEN 325 MG PO TABS
650.0000 mg | ORAL_TABLET | ORAL | Status: DC | PRN
  Administered 2017-04-04: 650 mg via ORAL
  Filled 2017-04-04: qty 2

## 2017-04-04 MED ORDER — VITAMIN B-1 100 MG PO TABS
100.0000 mg | ORAL_TABLET | Freq: Every day | ORAL | Status: DC
  Administered 2017-04-04: 100 mg via ORAL
  Filled 2017-04-04: qty 1

## 2017-04-04 MED ORDER — DICLOFENAC SODIUM 50 MG PO TBEC
50.0000 mg | DELAYED_RELEASE_TABLET | Freq: Two times a day (BID) | ORAL | Status: DC
  Administered 2017-04-04 – 2017-04-08 (×8): 50 mg via ORAL
  Filled 2017-04-04: qty 14
  Filled 2017-04-04 (×12): qty 1
  Filled 2017-04-04: qty 14

## 2017-04-04 MED ORDER — DULOXETINE HCL 20 MG PO CPEP
20.0000 mg | ORAL_CAPSULE | Freq: Two times a day (BID) | ORAL | Status: DC
  Administered 2017-04-04 – 2017-04-08 (×8): 20 mg via ORAL
  Filled 2017-04-04 (×3): qty 1
  Filled 2017-04-04: qty 14
  Filled 2017-04-04 (×8): qty 1
  Filled 2017-04-04: qty 14

## 2017-04-04 MED ORDER — LORAZEPAM 1 MG PO TABS: 0.0000 mg | ORAL_TABLET | Freq: Two times a day (BID) | ORAL | Status: DC

## 2017-04-04 MED ORDER — THIAMINE HCL 100 MG/ML IJ SOLN: 100.0000 mg | Freq: Once | INTRAMUSCULAR | Status: DC

## 2017-04-04 MED ORDER — LOPERAMIDE HCL 2 MG PO CAPS: 2.0000 mg | ORAL_CAPSULE | ORAL | Status: DC | PRN

## 2017-04-04 MED ORDER — DICLOFENAC SODIUM 50 MG PO TBEC
50.0000 mg | DELAYED_RELEASE_TABLET | Freq: Two times a day (BID) | ORAL | Status: DC
  Administered 2017-04-04: 50 mg via ORAL
  Filled 2017-04-04 (×3): qty 1

## 2017-04-04 MED ORDER — ALUM & MAG HYDROXIDE-SIMETH 200-200-20 MG/5ML PO SUSP: 30.0000 mL | ORAL | Status: DC | PRN

## 2017-04-04 MED ORDER — ZOLPIDEM TARTRATE 5 MG PO TABS: 5.0000 mg | ORAL_TABLET | Freq: Every evening | ORAL | Status: DC | PRN

## 2017-04-04 MED ORDER — METHOCARBAMOL 500 MG PO TABS
500.0000 mg | ORAL_TABLET | Freq: Two times a day (BID) | ORAL | Status: DC
  Administered 2017-04-04 – 2017-04-05 (×2): 500 mg via ORAL
  Filled 2017-04-04 (×5): qty 1

## 2017-04-04 MED ORDER — TRAZODONE HCL 50 MG PO TABS
50.0000 mg | ORAL_TABLET | Freq: Every evening | ORAL | Status: DC | PRN
  Filled 2017-04-04 (×5): qty 1

## 2017-04-04 MED ORDER — IBUPROFEN 400 MG PO TABS: 600.0000 mg | ORAL_TABLET | Freq: Three times a day (TID) | ORAL | Status: DC | PRN

## 2017-04-04 MED ORDER — HYDROXYZINE HCL 25 MG PO TABS: 25.0000 mg | ORAL_TABLET | Freq: Four times a day (QID) | ORAL | Status: DC | PRN

## 2017-04-04 MED ORDER — LORAZEPAM 1 MG PO TABS: 0.0000 mg | ORAL_TABLET | Freq: Four times a day (QID) | ORAL | Status: DC

## 2017-04-04 MED ORDER — TOPIRAMATE 25 MG PO TABS
25.0000 mg | ORAL_TABLET | Freq: Every day | ORAL | Status: DC
  Administered 2017-04-05: 25 mg via ORAL
  Filled 2017-04-04 (×2): qty 1

## 2017-04-04 MED ORDER — ADULT MULTIVITAMIN W/MINERALS CH
1.0000 | ORAL_TABLET | Freq: Every day | ORAL | Status: DC
  Administered 2017-04-05: 1 via ORAL
  Filled 2017-04-04 (×2): qty 1

## 2017-04-04 MED ORDER — ACETAMINOPHEN 325 MG PO TABS
650.0000 mg | ORAL_TABLET | ORAL | Status: DC | PRN
  Administered 2017-04-07: 650 mg via ORAL
  Filled 2017-04-04: qty 2

## 2017-04-04 MED ORDER — NICOTINE 7 MG/24HR TD PT24
7.0000 mg | MEDICATED_PATCH | Freq: Every day | TRANSDERMAL | Status: DC
  Administered 2017-04-05 – 2017-04-08 (×4): 7 mg via TRANSDERMAL
  Filled 2017-04-04 (×6): qty 1

## 2017-04-04 NOTE — ED Triage Notes (Signed)
TTS In progress.  

## 2017-04-04 NOTE — H&P (Signed)
Behavioral Health Medical Screening Exam  Leonard AngerRobert D [name] is an 36 y.o. male.  Total Time spent with patient: 15 minutes  Psychiatric Specialty Exam: Physical Exam  Constitutional: He is oriented to person, place, and time. He appears well-developed and well-nourished. No distress.  HENT:  Head: Normocephalic and atraumatic.  Right Ear: External ear normal.  Left Ear: External ear normal.  Eyes: Pupils are equal, round, and reactive to light.  Neck: Normal range of motion.  Cardiovascular: Normal rate, regular rhythm and normal heart sounds.   Respiratory: Effort normal and breath sounds normal. No respiratory distress.  Musculoskeletal: He exhibits tenderness.       Left shoulder: He exhibits decreased range of motion, tenderness, bony tenderness and decreased strength.  Neurological: He is alert and oriented to person, place, and time.  Skin: Skin is warm and dry. He is not diaphoretic.  Psychiatric: His mood appears anxious. His speech is rapid and/or pressured and tangential. He is hyperactive. He is not agitated, not slowed, not withdrawn and not actively hallucinating. Thought content is not paranoid and not delusional. Cognition and memory are normal. He expresses impulsivity and inappropriate judgment. He exhibits a depressed mood. He expresses homicidal ideation. He expresses no suicidal ideation. He expresses no homicidal plans. He is attentive.    Review of Systems  Musculoskeletal: Positive for joint pain.  Psychiatric/Behavioral: Positive for depression and substance abuse. Negative for hallucinations, memory loss and suicidal ideas. The patient is nervous/anxious and has insomnia.   All other systems reviewed and are negative.   Blood pressure 120/79, pulse 90, temperature 98.9 F (37.2 C), temperature source Oral, resp. rate 18, SpO2 96 %.There is no height or weight on file to calculate BMI.  General Appearance: Well Groomed  Eye Contact:  Good  Speech:  Pressured   Volume:  Normal  Mood:  Anxious, Depressed, Hopeless and Worthless  Affect:  Non-Congruent  Thought Process:  Disorganized  Orientation:  Full (Time, Place, and Person)  Thought Content:  Hallucinations: None and Tangential  Suicidal Thoughts:  No  Homicidal Thoughts:  Reports homicidal ideations at times when angry, not currently, no intent/plan  Memory:  Immediate;   Good Recent;   Good Remote;   Fair  Judgement:  Impaired  Insight:  Lacking  Psychomotor Activity:  Increased  Concentration: Concentration: Poor and Attention Span: Poor  Recall:  Good  Fund of Knowledge:Good  Language: Good  Akathisia:  No  Handed:  Right  AIMS (if indicated):     Assets:  Communication Skills Desire for Improvement Housing Leisure Time Physical Health  Sleep:       Musculoskeletal: Strength & Muscle Tone: within normal limits Gait & Station: normal   Blood pressure 120/79, pulse 90, temperature 98.9 F (37.2 C), temperature source Oral, resp. rate 18, SpO2 96 %.  Recommendations:  Based on my evaluation the patient does not appear to have an emergency medical condition.  Jackelyn PolingJason A Vicktoria Muckey, NP 04/04/2017, 1:05 AM

## 2017-04-04 NOTE — ED Notes (Signed)
A regular diet was ordered for Dinner.

## 2017-04-04 NOTE — Tx Team (Signed)
Initial Treatment Plan 04/04/2017 8:16 PM Cassandria Angerobert D Kattner [id number]    PATIENT STRESSORS: Substance abuse Other: Mother has stage 4 cancer   PATIENT STRENGTHS: Average or above average intelligence Capable of independent living Communication skills Financial means General fund of knowledge Motivation for treatment/growth Special hobby/interest Supportive family/friends Work skills   PATIENT IDENTIFIED PROBLEMS: Depression  Substance abuse  "I want to stop drinking long term"  ""Put myself with people that can help me after this stay"               DISCHARGE CRITERIA:  Improved stabilization in mood, thinking, and/or behavior Verbal commitment to aftercare and medication compliance Withdrawal symptoms are absent or subacute and managed without 24-hour nursing intervention  PRELIMINARY DISCHARGE PLAN: Outpatient therapy Medication management  PATIENT/FAMILY INVOLVEMENT: This treatment plan has been presented to and reviewed with the patient, Cassandria Angerobert D Goldwasser*.  The patient and family have been given the opportunity to ask questions and make suggestions.  Levin BaconHeather V Monic Engelmann, RN 04/04/2017, 8:16 PM

## 2017-04-04 NOTE — Progress Notes (Signed)
Leonard [name] is a 36 year old male being admitted voluntarily to [facility] from [facility].  He came to the ED reporting alcohol dependence and abusing cocaine/marijuana.  He reported depression.  He has recent stressor of his mother being diagnosed with Stage 4 cancer and she mad him her HCPOA.  He has a history of having seizures when coming off alcohol.  He has been drinking 1/5 pints of liquor per day.  High fall risk and seizure risk initiated.  He has yellow and green band on.  He reported that he is having pain in his left should after recent fall.  Pain was 8/10.  He has been taking Voltaren and Robaxin for the pain.  He denies any SI/HI or A/V hallucinations.  Oriented him to the unit.  Admission paperwork completed and signed.  Belongings searched and secured in locker # 40.  Skin assessment completed and noted multiple tattoos with no other skin issues.  Q 15 minute checks initiated for safety.  We will monitor the progress towards his goals.

## 2017-04-04 NOTE — Progress Notes (Signed)
Pt Accepted to:  [facility] after 5 pm per Julieanne Cottonina, AC. Accepting provider is: Dr. Jama Flavorsobos.  Call report to [phone]  Timmothy EulerJean T. Kaylyn LimSutter, MSW, LCSWA Clinical Social Work Disposition [phone]

## 2017-04-04 NOTE — Progress Notes (Signed)
[facility] Group Notes:  (Nursing/MHT/Case Management/Adjunct)  Date:  04/04/2017  Time:  11:52 PM  Type of Therapy:  Psychoeducational Skills  Participation Level:  Active  Participation Quality:  Appropriate  Affect:  Appropriate  Cognitive:  Appropriate  Insight:  Good  Engagement in Group:  Engaged  Modes of Intervention:  Education  Summary of Progress/Problems: The patient expressed in group that he was relieved to having had a lot of rest earlier in the day. He explained that normally, he has difficulty sleeping and can stay up for days. He also stated that he didn't realize how sleep deprived he was until he came to the hospital.   Westly PamGOODMAN, Ottie Neglia S 04/04/2017, 11:52 PM

## 2017-04-04 NOTE — ED Notes (Signed)
Pt has been changed out and wanded by security 

## 2017-04-04 NOTE — BH Assessment (Addendum)
Tele Assessment Note   Leonard [name] is an 36 y.o. male who presents to [facility] voluntarily as a walk-in. Pt reportedly has been consuming excessive amount of alcohol and abusing drugs. Pt appears disheveled during the assessment often talking loudly and laughing inappropriately throughout the assessment.   Pt tearful during the assessment and identifies multiple recent stressors including his mother being diagnosed with stage 4 cancer and recently deteriorating and losing a lot of weight due to the cancer, legal issues due to drinking, feelings of guilt, increased drug and alcohol use and feeling hopeless.   Pt reports limited protective factors including his career and his relationship with his children. Pt appears tangential during the assessment often needing redirection to stay on task and presents difficulty concentrating.   Per Nira Conn, NP pt meets inpt criteria due to increased risk factors and AMS. Pt to be transported to [facility] to await bed placement. Charge nurse contacted and notified of pt's expected arrival.   Diagnosis: Major Depressive D/O, single episode; Cannabis Use D/O; Alcohol Use D/O  Past Medical History:  Past Medical History:  Diagnosis Date  . Alcohol abuse   . Seizures (HCC)    while detoxing    No past surgical history on file.  Family History:  Family History  Problem Relation Age of Onset  . Cancer Other   . Hypertension Other     Social History:  reports that he has been smoking Cigarettes.  He has been smoking about 0.25 packs per day. He has never used smokeless tobacco. He reports that he drinks about 42.0 oz of alcohol per week . He reports that he does not use drugs.  Additional Social History:  Alcohol / Drug Use Pain Medications: See PTA meds Prescriptions: See PTA meds Over the Counter: See PTA meds History of alcohol / drug use?: Yes Negative Consequences of Use: Personal relationships, Financial, Armed forces operational officer, Work / School Substance  #1 Name of Substance 1: Alcohol 1 - Age of First Use: unknown 1 - Amount (size/oz): 1 pint 1 - Frequency: daily 1 - Duration: ongoing 1 - Last Use / Amount: 04/03/17 Substance #2 Name of Substance 2: Marijuana  2 - Age of First Use: unknown 2 - Amount (size/oz): "blunts" 2 - Frequency: daily 2 - Duration: ongoing 2 - Last Use / Amount: 04/03/17  CIWA: CIWA-Ar BP: 120/79 Pulse Rate: 90 COWS:    PATIENT STRENGTHS: (choose at least two) Average or above average intelligence Capable of independent living Communication skills General fund of knowledge Motivation for treatment/growth  Allergies: No Known Allergies  Home Medications:  (Not in a hospital admission)  OB/GYN Status:  No LMP for male patient.  General Assessment Data Location of Assessment: [facility] Assessment Services TTS Assessment: In system Is this a Tele or Face-to-Face Assessment?: Face-to-Face Is this an Initial Assessment or a Re-assessment for this encounter?: Initial Assessment Marital status: Separated Is patient pregnant?: No Pregnancy Status: No Living Arrangements: Other (Comment) (unknown, pt stated "that's a hard question.") Can pt return to current living arrangement?: Yes Admission Status: Voluntary Is patient capable of signing voluntary admission?: Yes Referral Source: Self/Family/Friend Insurance type: none  Medical Screening Exam [facility] Walk-in ONLY) Medical Exam completed: Yes  Crisis Care Plan Living Arrangements: Other (Comment) (unknown, pt stated "that's a hard question.") Name of Psychiatrist: none Name of Therapist: none  Education Status Is patient currently in school?: No Highest grade of school patient has completed: some college   Risk to self with the past  6 months Suicidal Ideation: No Has patient been a risk to self within the past 6 months prior to admission? : No Suicidal Intent: No Has patient had any suicidal intent within the past 6 months prior to admission? :  No Is patient at risk for suicide?: No Suicidal Plan?: No Has patient had any suicidal plan within the past 6 months prior to admission? : No Access to Means: No What has been your use of drugs/alcohol within the last 12 months?: reports to daily use of alcohol and other drugs  Previous Attempts/Gestures: No Triggers for Past Attempts: None known Intentional Self Injurious Behavior: None Family Suicide History: No Recent stressful life event(s): Divorce, Trauma (Comment) (mother has stage 4 cancer, increased SA) Persecutory voices/beliefs?: No Depression: Yes Depression Symptoms: Despondent, Insomnia, Tearfulness, Feeling angry/irritable, Feeling worthless/self pity Substance abuse history and/or treatment for substance abuse?: Yes Suicide prevention information given to non-admitted patients: Not applicable  Risk to Others within the past 6 months Homicidal Ideation: No Does patient have any lifetime risk of violence toward others beyond the six months prior to admission? : No Thoughts of Harm to Others: No-Not Currently Present/Within Last 6 Months Current Homicidal Intent: No-Not Currently/Within Last 6 Months Current Homicidal Plan: No Access to Homicidal Means: No History of harm to others?: Yes Assessment of Violence: In past 6-12 months Violent Behavior Description: pt reports he has hx of HI towards others but does not disclose the individual and denies current HI  Does patient have access to weapons?: No (pt laughing inappropriately stated "no") Criminal Charges Pending?: Yes Describe Pending Criminal Charges: DUI Does patient have a court date: Yes Court Date: 04/27/17 Is patient on probation?: No  Psychosis Hallucinations: None noted Delusions: None noted  Mental Status Report Appearance/Hygiene: Disheveled Eye Contact: Good Motor Activity: Unsteady, Rigidity Speech: Loud, Tangential Level of Consciousness: Crying, Alert Mood: Depressed, Ashamed/humiliated,  Despair, Helpless, Guilty, Sad Affect: Depressed, Sad Anxiety Level: Minimal Thought Processes: Tangential Judgement: Impaired Orientation: Person, Place, Appropriate for developmental age Obsessive Compulsive Thoughts/Behaviors: None  Cognitive Functioning Concentration: Fair Memory: Remote Intact, Recent Intact IQ: Average Insight: Poor Impulse Control: Poor Appetite: Poor Sleep: Decreased Total Hours of Sleep: 2 Vegetative Symptoms: None  ADLScreening [facility] Assessment Services) Patient's cognitive ability adequate to safely complete daily activities?: Yes Patient able to express need for assistance with ADLs?: Yes Independently performs ADLs?: Yes (appropriate for developmental age)  Prior Inpatient Therapy Prior Inpatient Therapy: Yes Prior Therapy Dates: 2016 Prior Therapy Facilty/Provider(s): [facility] Reason for Treatment: SA  Prior Outpatient Therapy Prior Outpatient Therapy: No Does patient have an ACCT team?: No Does patient have Intensive In-House Services?  : No Does patient have [facility] services? : No Does patient have P4CC services?: No  ADL Screening (condition at time of admission) Patient's cognitive ability adequate to safely complete daily activities?: Yes Is the patient deaf or have difficulty hearing?: No Does the patient have difficulty seeing, even when wearing glasses/contacts?: No Does the patient have difficulty concentrating, remembering, or making decisions?: No Patient able to express need for assistance with ADLs?: Yes Does the patient have difficulty dressing or bathing?: No Independently performs ADLs?: Yes (appropriate for developmental age) Does the patient have difficulty walking or climbing stairs?: No Weakness of Legs: None Weakness of Arms/Hands: None  Home Assistive Devices/Equipment Home Assistive Devices/Equipment: None    Abuse/Neglect Assessment (Assessment to be complete while patient is alone) Physical Abuse: Denies Verbal  Abuse: Yes, past (Comment) (childhood) Sexual Abuse: Denies Exploitation of patient/patient's resources:  Denies Self-Neglect: Denies     Merchant navy officer (For Healthcare) Does Patient Have a Medical Advance Directive?: No Would patient like information on creating a medical advance directive?: No - Patient declined    Additional Information 1:1 In Past 12 Months?: No CIRT Risk: No Elopement Risk: No Does patient have medical clearance?:  (pending)     Disposition:  Disposition Initial Assessment Completed for this Encounter: Yes Disposition of Patient: Inpatient treatment program Type of inpatient treatment program: Adult (per Nira Conn, NP)  Karolee Ohs 04/04/2017 1:43 AM

## 2017-04-04 NOTE — ED Triage Notes (Signed)
Pt states wanting to get off of drugs and alcohol. Pt states last drink tonight. Pt states drinks pint of liquor/day. Pt also admits to cocaine use. Pt states last used two days ago. Pt denies any SI/HI. Pt denies any visual or auditory hallucinations.

## 2017-04-04 NOTE — BH Assessment (Signed)
[facility] Assessment Progress Note  Reassessment: pt wants detox. Pt's mom has stage 4 breast cancer and is refusing treatment, "I need to be sober so I can deal with this and help her. She made me power of attorney". 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 Pt states that he went to rehab in 2012 [facility], stayed sober 6 mo. Pt drinks 1 pint and a half per day, has had withdrawal seizures in the past, "they are coming soon".  Pt is alert and oriented, with normal thought process, movement, speech. There is no evidence he is responding to internal stimuli. Pt denies SI, HI, AVH. TTS to seek placement.

## 2017-04-04 NOTE — ED Provider Notes (Signed)
[facility] DEPT Provider Note   CSN: [id number] Arrival date & time: 04/04/17  0135     History   Chief Complaint Chief Complaint  Patient presents with  . Addiction Problem    HPI Leonard [name] is a 36 y.o. male.  The history is provided by the patient.  He was sent here from [facility] behavioral health. He presented there for help with getting off of his current pattern of drug use. He drinks 1 pint of liquor a day and also uses marijuana and cocaine. He does admit to depression but denies suicidal ideation. He denies hallucinations. He has gone through DTs in the past. He was told that he met inpatient criteria but that was not available, so he was sent here pending appropriate placement. He has had severe stress in that his mother was recently diagnosed with stage IV cancer.  Past Medical History:  Diagnosis Date  . Alcohol abuse   . Seizures ([place])    while detoxing    There are no active problems to display for this patient.   History reviewed. No pertinent surgical history.     Home Medications    Prior to Admission medications   Medication Sig Start Date End Date Taking? Authorizing Provider  diclofenac (VOLTAREN) 50 MG EC tablet Take 1 tablet (50 mg total) by mouth 2 (two) times daily. 03/27/17   Ashley Murrain, NP  methocarbamol (ROBAXIN) 500 MG tablet Take 1 tablet (500 mg total) by mouth 2 (two) times daily. 03/27/17   Ashley Murrain, NP  Multiple Vitamin (MULTIVITAMIN WITH MINERALS) TABS tablet Take 1 tablet by mouth daily.    [provider]    Family History Family History  Problem Relation Age of Onset  . Cancer Other   . Hypertension Other     Social History Social History  Substance Use Topics  . Smoking status: Current Some Day Smoker    Packs/day: 0.25    Types: Cigarettes  . Smokeless tobacco: Never Used  . Alcohol use 42.0 oz/week    70 Shots of liquor per week     Comment: 2 pints cognac weekly, last drink 3/17      Allergies   Patient has no known allergies.   Review of Systems Review of Systems  All other systems reviewed and are negative.    Physical Exam Updated Vital Signs BP (!) 136/92   Pulse (!) 59   Temp 98 F (36.7 C) (Oral)   Resp 16   SpO2 94%   Physical Exam  Nursing note and vitals reviewed.  36 year old male, resting comfortably and in no acute distress. Vital signs are significant for borderline hypertension and borderline bradycardia. Oxygen saturation is 94%, which is normal. Head is normocephalic and atraumatic. PERRLA, EOMI. Oropharynx is clear. Neck is nontender and supple without adenopathy or JVD. Back is nontender and there is no CVA tenderness. Lungs are clear without rales, wheezes, or rhonchi. Chest is nontender. Heart has regular rate and rhythm without murmur. Abdomen is soft, flat, nontender without masses or hepatosplenomegaly and peristalsis is normoactive. Extremities have no cyanosis or edema, full range of motion is present. Skin is warm and dry without rash. Neurologic: Mental status is normal, cranial nerves are intact, there are no motor or sensory deficits.  ED Treatments / Results  Labs (all labs ordered are listed, but only abnormal results are displayed) Labs Reviewed  COMPREHENSIVE METABOLIC PANEL - Abnormal; Notable for the following:  Result Value   Creatinine, Ser 1.27 (*)    Calcium 8.8 (*)    All other components within normal limits  ETHANOL - Abnormal; Notable for the following:    Alcohol, Ethyl (B) 148 (*)    All other components within normal limits  RAPID URINE DRUG SCREEN, HOSP PERFORMED - Abnormal; Notable for the following:    Cocaine POSITIVE (*)    Tetrahydrocannabinol POSITIVE (*)    All other components within normal limits  CBC    Procedures Procedures (including critical care time)  Medications Ordered in ED Medications  LORazepam (ATIVAN) tablet 0-4 mg (not administered)    Followed by   LORazepam (ATIVAN) tablet 0-4 mg (not administered)  thiamine (VITAMIN B-1) tablet 100 mg (not administered)    Or  thiamine (B-1) injection 100 mg (not administered)  alum & mag hydroxide-simeth (MAALOX/MYLANTA) 200-200-20 MG/5ML suspension 30 mL (not administered)  ondansetron (ZOFRAN) tablet 4 mg (not administered)  nicotine (NICODERM CQ - dosed in mg/24 hr) patch 7 mg (not administered)  zolpidem (AMBIEN) tablet 5 mg (not administered)  ibuprofen (ADVIL,MOTRIN) tablet 600 mg (not administered)  acetaminophen (TYLENOL) tablet 650 mg (not administered)  diclofenac (VOLTAREN) EC tablet 50 mg (not administered)  methocarbamol (ROBAXIN) tablet 500 mg (not administered)     Initial Impression / Assessment and Plan / ED Course  I have reviewed the triage vital signs and the nursing notes.  Pertinent labs & imaging results that were available during my care of the patient were reviewed by me and considered in my medical decision making (see chart for details).  Polysubstance abuse. Depression. Hospital records were reviewed confirming evaluation and [facility] where he was determined to meet inpatient criteria. Although he is does not show signs of alcohol withdrawal at this point, he is started on CIWA protocol. He is placed in psychiatric holding pending basement for detox.  Final Clinical Impressions(s) / ED Diagnoses   Final diagnoses:  Polysubstance abuse    New Prescriptions New Prescriptions   No medications on file     Delora Fuel, MD 16/10/96 [phone]

## 2017-04-04 NOTE — ED Provider Notes (Signed)
Vitals:   04/04/17 1200 04/04/17 1210  BP: (!) 144/91 (!) 119/96  Pulse: 63 (!) 57  Resp: 20   Temp: 98 F (36.7 C)    Accepted for transfer.  VSS   Linwood DibblesKnapp, Suzana Sohail, MD 04/04/17 1754

## 2017-04-05 DIAGNOSIS — F322 Major depressive disorder, single episode, severe without psychotic features: Secondary | ICD-10-CM

## 2017-04-05 DIAGNOSIS — F1020 Alcohol dependence, uncomplicated: Secondary | ICD-10-CM

## 2017-04-05 MED ORDER — METHOCARBAMOL 500 MG PO TABS
500.0000 mg | ORAL_TABLET | Freq: Three times a day (TID) | ORAL | Status: DC | PRN
  Administered 2017-04-05 – 2017-04-07 (×4): 500 mg via ORAL
  Filled 2017-04-05 (×5): qty 1

## 2017-04-05 MED ORDER — BENZOCAINE 10 % MT GEL
Freq: Three times a day (TID) | OROMUCOSAL | Status: DC | PRN
  Administered 2017-04-07: 08:00:00 via OROMUCOSAL

## 2017-04-05 MED ORDER — TRAZODONE HCL 50 MG PO TABS
50.0000 mg | ORAL_TABLET | Freq: Every evening | ORAL | Status: DC | PRN
  Filled 2017-04-05: qty 7

## 2017-04-05 MED ORDER — VITAMIN B-1 100 MG PO TABS
100.0000 mg | ORAL_TABLET | Freq: Every day | ORAL | Status: DC
  Administered 2017-04-06 – 2017-04-08 (×3): 100 mg via ORAL
  Filled 2017-04-05 (×5): qty 1

## 2017-04-05 MED ORDER — IBUPROFEN 600 MG PO TABS
600.0000 mg | ORAL_TABLET | Freq: Four times a day (QID) | ORAL | Status: DC | PRN
  Administered 2017-04-06 – 2017-04-07 (×2): 600 mg via ORAL
  Filled 2017-04-05 (×2): qty 1

## 2017-04-05 MED ORDER — LOPERAMIDE HCL 2 MG PO CAPS: 2.0000 mg | ORAL_CAPSULE | ORAL | Status: AC | PRN

## 2017-04-05 MED ORDER — HYDROXYZINE HCL 25 MG PO TABS
25.0000 mg | ORAL_TABLET | Freq: Four times a day (QID) | ORAL | Status: AC | PRN
  Administered 2017-04-06 – 2017-04-07 (×2): 25 mg via ORAL
  Filled 2017-04-05: qty 10
  Filled 2017-04-05 (×2): qty 1

## 2017-04-05 MED ORDER — LORAZEPAM 1 MG PO TABS: 1.0000 mg | ORAL_TABLET | Freq: Every day | ORAL | Status: DC

## 2017-04-05 MED ORDER — LORAZEPAM 1 MG PO TABS
1.0000 mg | ORAL_TABLET | Freq: Two times a day (BID) | ORAL | Status: AC
  Administered 2017-04-07 – 2017-04-08 (×2): 1 mg via ORAL
  Filled 2017-04-05 (×2): qty 1

## 2017-04-05 MED ORDER — LORAZEPAM 1 MG PO TABS: 1.0000 mg | ORAL_TABLET | Freq: Four times a day (QID) | ORAL | Status: AC | PRN

## 2017-04-05 MED ORDER — ONDANSETRON 4 MG PO TBDP: 4.0000 mg | ORAL_TABLET | Freq: Four times a day (QID) | ORAL | Status: AC | PRN

## 2017-04-05 MED ORDER — ADULT MULTIVITAMIN W/MINERALS CH
1.0000 | ORAL_TABLET | Freq: Every day | ORAL | Status: DC
  Administered 2017-04-05 – 2017-04-08 (×4): 1 via ORAL
  Filled 2017-04-05 (×7): qty 1

## 2017-04-05 MED ORDER — THIAMINE HCL 100 MG/ML IJ SOLN
100.0000 mg | Freq: Once | INTRAMUSCULAR | Status: AC
  Administered 2017-04-05: 100 mg via INTRAMUSCULAR
  Filled 2017-04-05: qty 2

## 2017-04-05 MED ORDER — ACAMPROSATE CALCIUM 333 MG PO TBEC
666.0000 mg | DELAYED_RELEASE_TABLET | Freq: Three times a day (TID) | ORAL | Status: DC
  Administered 2017-04-05 – 2017-04-08 (×9): 666 mg via ORAL
  Filled 2017-04-05: qty 42
  Filled 2017-04-05 (×7): qty 2
  Filled 2017-04-05: qty 42
  Filled 2017-04-05: qty 2
  Filled 2017-04-05: qty 42
  Filled 2017-04-05 (×3): qty 2

## 2017-04-05 MED ORDER — LORAZEPAM 1 MG PO TABS
1.0000 mg | ORAL_TABLET | Freq: Four times a day (QID) | ORAL | Status: AC
  Administered 2017-04-05 – 2017-04-06 (×4): 1 mg via ORAL
  Filled 2017-04-05 (×4): qty 1

## 2017-04-05 MED ORDER — LORAZEPAM 1 MG PO TABS
1.0000 mg | ORAL_TABLET | Freq: Three times a day (TID) | ORAL | Status: AC
  Administered 2017-04-06 – 2017-04-07 (×3): 1 mg via ORAL
  Filled 2017-04-05 (×3): qty 1

## 2017-04-05 NOTE — Tx Team (Signed)
Interdisciplinary Treatment and Diagnostic Plan Update 04/05/2017 Time of Session: 9:30am  Leonard [name]  MRN: [id number]  Principal Diagnosis: MDD (major depressive disorder), single episode  Secondary Diagnoses: Principal Problem:   MDD (major depressive disorder), single episode   Current Medications:  Current Facility-Administered Medications  Medication Dose Route Frequency Provider Last Rate Last Dose  . acamprosate (CAMPRAL) tablet 666 mg  666 mg Oral TID WC Cobos, Myer Peer, MD      . acetaminophen (TYLENOL) tablet 650 mg  650 mg Oral Q4H PRN Niel Hummer, NP      . alum & mag hydroxide-simeth (MAALOX/MYLANTA) 200-200-20 MG/5ML suspension 30 mL  30 mL Oral PRN Niel Hummer, NP      . benzocaine (ORAJEL) 10 % mucosal gel   Mouth/Throat TID PRN Laverle Hobby, PA-C      . diclofenac (VOLTAREN) EC tablet 50 mg  50 mg Oral BID Elmarie Shiley A, NP   50 mg at 04/05/17 0840  . DULoxetine (CYMBALTA) DR capsule 20 mg  20 mg Oral BID Laverle Hobby, PA-C   20 mg at 04/05/17 0840  . hydrOXYzine (ATARAX/VISTARIL) tablet 25 mg  25 mg Oral Q6H PRN Cobos, Myer Peer, MD      . ibuprofen (ADVIL,MOTRIN) tablet 600 mg  600 mg Oral Q6H PRN Patriciaann Clan E, PA-C      . loperamide (IMODIUM) capsule 2-4 mg  2-4 mg Oral PRN Cobos, Myer Peer, MD      . LORazepam (ATIVAN) tablet 1 mg  1 mg Oral Q6H PRN Cobos, Myer Peer, MD      . LORazepam (ATIVAN) tablet 1 mg  1 mg Oral QID Cobos, Myer Peer, MD   1 mg at 04/05/17 1400   Followed by  . [START ON 04/06/2017] LORazepam (ATIVAN) tablet 1 mg  1 mg Oral TID Cobos, Myer Peer, MD       Followed by  . [START ON 04/07/2017] LORazepam (ATIVAN) tablet 1 mg  1 mg Oral BID Cobos, Myer Peer, MD       Followed by  . [START ON 04/09/2017] LORazepam (ATIVAN) tablet 1 mg  1 mg Oral Daily Cobos, Fernando A, MD      . methocarbamol (ROBAXIN) tablet 500 mg  500 mg Oral Q8H PRN Cobos, Myer Peer, MD      . multivitamin with minerals tablet 1 tablet  1  tablet Oral Daily Cobos, Myer Peer, MD   1 tablet at 04/05/17 1400  . nicotine (NICODERM CQ - dosed in mg/24 hr) patch 7 mg  7 mg Transdermal Daily Elmarie Shiley A, NP   7 mg at 04/05/17 0840  . ondansetron (ZOFRAN-ODT) disintegrating tablet 4 mg  4 mg Oral Q6H PRN Cobos, Myer Peer, MD      . Derrill Memo ON 04/06/2017] thiamine (VITAMIN B-1) tablet 100 mg  100 mg Oral Daily Cobos, Myer Peer, MD      . traZODone (DESYREL) tablet 50 mg  50 mg Oral QHS PRN Cobos, Myer Peer, MD        PTA Medications: Prescriptions Prior to Admission  Medication Sig Dispense Refill Last Dose  . diclofenac (VOLTAREN) 50 MG EC tablet Take 1 tablet (50 mg total) by mouth 2 (two) times daily. (Patient taking differently: Take 50 mg by mouth 2 (two) times daily as needed for mild pain. ) 15 tablet 0 Past Week at Unknown time  . methocarbamol (ROBAXIN) 500 MG tablet Take 1 tablet (500 mg total) by mouth  2 (two) times daily. (Patient taking differently: Take 500 mg by mouth 2 (two) times daily as needed for muscle spasms. ) 20 tablet 0 Past Week at Unknown time  . Multiple Vitamin (MULTIVITAMIN WITH MINERALS) TABS tablet Take 1 tablet by mouth daily.   Past Week at Unknown time    Treatment Modalities: Medication Management, Group therapy, Case management,  1 to 1 session with clinician, Psychoeducation, Recreational therapy.  Patient Stressors: Substance abuse Other: Mother has stage 4 cancer Patient Strengths: Average or above average intelligence Capable of independent living Occupational psychologist fund of knowledge Motivation for treatment/growth Special hobby/interest Supportive family/friends Work Artist for Primary Diagnosis: MDD (major depressive disorder), single episode Long Term Goal(s): Improvement in symptoms so as ready for discharge Short Term Goals: Ability to verbalize feelings will improve Ability to disclose and discuss suicidal ideas Ability to  demonstrate self-control will improve Ability to identify and develop effective coping behaviors will improve Ability to maintain clinical measurements within normal limits will improve Ability to identify triggers associated with substance abuse/mental health issues will improve  Medication Management: Evaluate patient's response, side effects, and tolerance of medication regimen.  Therapeutic Interventions: 1 to 1 sessions, Unit Group sessions and Medication administration.  Evaluation of Outcomes: Not Met  Physician Treatment Plan for Secondary Diagnosis: Principal Problem:   MDD (major depressive disorder), single episode  Long Term Goal(s): Improvement in symptoms so as ready for discharge  Short Term Goals: Ability to verbalize feelings will improve Ability to disclose and discuss suicidal ideas Ability to demonstrate self-control will improve Ability to identify and develop effective coping behaviors will improve Ability to maintain clinical measurements within normal limits will improve Ability to identify triggers associated with substance abuse/mental health issues will improve  Medication Management: Evaluate patient's response, side effects, and tolerance of medication regimen.  Therapeutic Interventions: 1 to 1 sessions, Unit Group sessions and Medication administration.  Evaluation of Outcomes: Not Met  RN Treatment Plan for Primary Diagnosis: MDD (major depressive disorder), single episode Long Term Goal(s): Knowledge of disease and therapeutic regimen to maintain health will improve  Short Term Goals: Ability to verbalize feelings will improve and Compliance with prescribed medications will improve  Medication Management: RN will administer medications as ordered by provider, will assess and evaluate patient's response and provide education to patient for prescribed medication. RN will report any adverse and/or side effects to prescribing provider.  Therapeutic  Interventions: 1 on 1 counseling sessions, Psychoeducation, Medication administration, Evaluate responses to treatment, Monitor vital signs and CBGs as ordered, Perform/monitor CIWA, COWS, AIMS and Fall Risk screenings as ordered, Perform wound care treatments as ordered.  Evaluation of Outcomes: Not Met  LCSW Treatment Plan for Primary Diagnosis: MDD (major depressive disorder), single episode Long Term Goal(s): Safe transition to appropriate next level of care at discharge, Engage patient in therapeutic group addressing interpersonal concerns. Short Term Goals: Engage patient in aftercare planning with referrals and resources, Facilitate patient progression through stages of change regarding substance use diagnoses and concerns, Identify triggers associated with mental health/substance abuse issues and Increase skills for wellness and recovery  Therapeutic Interventions: Assess for all discharge needs, 1 to 1 time with Social worker, Explore available resources and support systems, Assess for adequacy in community support network, Educate family and significant other(s) on suicide prevention, Complete Psychosocial Assessment, Interpersonal group therapy.  Evaluation of Outcomes: Not Met  Progress in Treatment: Attending groups: Pt is new to milieu, continuing to assess  Participating in groups: Pt is new to milieu, continuing to assess  Taking medication as prescribed: Yes, MD continues to assess for medication changes as needed Toleration medication: Yes, no side effects reported at this time Family/Significant other contact made: No, CSW assessing for appropriate contact Patient understands diagnosis: Continuing to assess Discussing patient identified problems/goals with staff: Yes Medical problems stabilized or resolved: Yes Denies suicidal/homicidal ideation: Yes Issues/concerns per patient self-inventory: None Other: N/A  New problem(s) identified: None identified at this time.    New Short Term/Long Term Goal(s): None identified at this time.   Discharge Plan or Barriers: Pt will return home and follow up outpatient with [facility].  Reason for Continuation of Hospitalization:  Depression Medication stabilization Suicidal ideation Withdrawal symptoms  Estimated Length of Stay: 3-5 days  Attendees: Patient: 04/05/2017 3:48 PM  Physician: Dr. Parke Poisson 04/05/2017 3:48 PM  Nursing: Opal Sidles RN; [place], RN 04/05/2017 3:48 PM  RN Care Manager: Lars Pinks, RN 04/05/2017 3:48 PM  Social Worker: Adriana Reams, LCSW; Matthew Saras, [place] 04/05/2017 3:48 PM  Recreational Therapist:  04/05/2017 3:48 PM  Other: Lindell Spar, NP; Samuel Jester, NP 04/05/2017 3:48 PM  Other:  04/05/2017 3:48 PM  Other: 04/05/2017 3:48 PM   Scribe for Treatment Team: Georga Kaufmann, MSW,LCSWA 04/05/2017 3:48 PM
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
## 2017-04-05 NOTE — Progress Notes (Signed)
Adult Psychoeducational Group Note  Date:  04/05/2017 Time:  9:12 PM  Group Topic/Focus:  Wrap-Up Group:   The focus of this group is to help patients review their daily goal of treatment and discuss progress on daily workbooks.  Participation Level:  Active  Participation Quality:  Appropriate and Attentive  Affect:  Appropriate  Cognitive:  Appropriate  Insight: Appropriate  Engagement in Group:  Engaged  Modes of Intervention:  Discussion  Additional Comments:  Pt stated his day was an 8 and he had a good day. Pt stated his goal was to listen and learn something. Pt stated he slept good and participated in group and paid attention.  Caswell CorwinOwen, Payden Bonus C 04/05/2017, 9:12 PM

## 2017-04-05 NOTE — Progress Notes (Addendum)
D: Patient appears reluctant to take his medications this morning stating, "I shouldn't be on all these medications; I'm only here because I have a problem with alcohol."  He reports poor sleep and appetite; his energy level is normal and his concentration is good.  He reports withdrawal symptoms as diarrhea and chilling.  Patient denies any thoughts of self harm.  He has attended some groups, however, has been staying in bed most of the day.  Patient was started on ativan protocol and is tolerating well. He rates his depression as a 3; denies hopelessness and anxiety as a 2.   A: Continue to monitor medication management and MD orders.  Safety checks completed every 15 minutes per protocol.  Offer support and encouragement as needed. R: Patient is receptive to staff; his behavior is appropriate.

## 2017-04-05 NOTE — [facility] Suicide Risk Assessment (Signed)
Leonard [name]   Nursing information obtained from:  Patient and chart  Demographic factors: 36 year old married male , employed  Current Mental Status:  See below Loss Factors:  Mother is medically ill  Historical Factors:  History of depression, history of alcohol use disorder  Risk Reduction Factors:  Employed, Sense of responsibility to family, Responsible for children under 35 years of age, Living with another person, especially a relative, Positive social support  Total Time spent with patient: 45 minutes Principal Problem:  MDD, Alcohol Dependence Diagnosis:   Patient Active Problem List   Diagnosis Date Noted  . MDD (major depressive disorder), single episode [F32.9] 04/04/2017    Continued Clinical Symptoms:  Alcohol Use Disorder Identification Test Final Score (AUDIT): 33 The "Alcohol Use Disorders Identification Test", Guidelines for Use in Primary Care, Second Edition.  World Science writerHealth Organization [facility](WHO). Score between 0-7:  no or low risk or alcohol related problems. Score between 8-15:  moderate risk of alcohol related problems. Score between 16-19:  high risk of alcohol related problems. Score 20 or above:  warrants further diagnostic evaluation for alcohol dependence and treatment.   CLINICAL FACTORS:  36 year old married male, history of alcohol dependence, has been drinking daily and heavily. Also using cannabis daily and cocaine 1 x week or so. Reports increased depression over recent weeks related to his mother being terminally ill from cancer. 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    Psychiatric Specialty Exam: Physical Exam  ROS  Blood pressure 128/83, pulse 64, temperature 97.8 F (36.6 C), temperature source Oral, resp. rate 16, height 6' 1.5" (1.867 m), weight 86.9 kg (191 lb 8 oz), SpO2 100 %.Body mass index is 24.92 kg/m.   see admission note MSE    COGNITIVE FEATURES THAT CONTRIBUTE TO RISK:  Closed-mindedness  and Loss of executive function    SUICIDE RISK:   Moderate:  Frequent suicidal ideation with limited intensity, and duration, some specificity in terms of plans, no associated intent, good self-control, limited dysphoria/symptomatology, some risk factors present, and identifiable protective factors, including available and accessible social support.  PLAN OF CARE: Patient will be admitted to inpatient psychiatric unit for stabilization and safety. Will provide and encourage milieu participation. Provide medication management and maked adjustments as needed.  Also provide medication management to minimize risk of ETOH WDL. Will follow daily.    I certify that inpatient services furnished can reasonably be expected to improve the patient's condition.   Craige CottaFernando A Cobos, MD 04/05/2017, 12:36 PM

## 2017-04-05 NOTE — H&P (Addendum)
Psychiatric Admission Assessment Adult  Patient Identification: Leonard [name] MRN:  [id number] Date of Evaluation:  04/05/2017 Chief Complaint:  " I need to stop drinking" Principal Diagnosis: Alcohol Dependence, Depression - ( MDD versus Alcohol Induced )  Diagnosis:   Patient Active Problem List   Diagnosis Date Noted  . MDD (major depressive disorder), single episode [F32.9] 04/04/2017   History of Present Illness: =36 year old married male. Reports history of alcohol dependence, was drinking up to one and a half pints of liquor daily. To a lesser degree, has been using cocaine , 1 x per week or less. He reports that he has been drinking more recently because of depression. His mother is terminally ill ( states she has cancer , with metastasis, and has decided to forego any further treatment) .  5/14 BAL 148, UDS positive for cocaine and cannabis .  Endorses neuro-vegetative symptoms of depression, but denies any suicidal ideations. No psychotic symptoms.  Associated Signs/Symptoms: Depression Symptoms:  depressed mood, anhedonia, insomnia, loss of energy/fatigue, decreased appetite, (Hypo) Manic Symptoms: none Anxiety Symptoms:  Does not endorse, other than worrying about mother, as above  Psychotic Symptoms:  Does not endorse  PTSD Symptoms: Does not endorse  Total Time spent with patient: 45 minutes  Past Psychiatric History: no prior psychiatric admissions , has never attempted suicide, denies any history or self injurious behaviors,  denies history of psychosis, no history of mania. Denies history of violence .  Is the patient at risk to self? No.  Has the patient been a risk to self in the past 6 months? No.  Has the patient been a risk to self within the distant past? No.  Is the patient a risk to others? No.  Has the patient been a risk to others in the past 6 months? No.  Has the patient been a risk to others within the distant past? No.   Prior Inpatient  Therapy:  denies  Prior Outpatient Therapy:  denies   Alcohol Screening: 1. How often do you have a drink containing alcohol?: 4 or more times a week 2. How many drinks containing alcohol do you have on a typical day when you are drinking?: 10 or more 3. How often do you have six or more drinks on one occasion?: Daily or almost daily Preliminary Score: 8 4. How often during the last year have you found that you were not able to stop drinking once you had started?: Daily or almost daily 5. How often during the last year have you failed to do what was normally expected from you becasue of drinking?: Monthly 6. How often during the last year have you needed a first drink in the morning to get yourself going after a heavy drinking session?: Daily or almost daily 7. How often during the last year have you had a feeling of guilt of remorse after drinking?: Daily or almost daily 8. How often during the last year have you been unable to remember what happened the night before because you had been drinking?: Weekly 9. Have you or someone else been injured as a result of your drinking?: No 10. Has a relative or friend or a doctor or another health worker been concerned about your drinking or suggested you cut down?: Yes, during the last year Alcohol Use Disorder Identification Test Final Score (AUDIT): 33 Brief Intervention: Yes Substance Abuse History in the last 12 months:  Alcohol use disorder, states he drinks up to 1.5 pints of liquor  per day. Longest sobriety about 6 months, relapsed October 2017. Cocaine Abuse, uses 1 x per week at times, but not daily. Endorses cannabis dependence, smokes daily . Consequences of Substance Abuse: Past history of blackouts, history of WDL seizure x 1 , history of DUIs  Previous Psychotropic Medications: states he has never been on any psychiatric medication  Psychological Evaluations:  No  Past Medical History: denies  Past Medical History:  Diagnosis Date  .  Alcohol abuse   . Seizures ([place])    while detoxing   History reviewed. No pertinent surgical history. Family History: father deceased - aneurysm(?), mother chronically ill, patient reports has advanced Ca with metastasis, 2 siblings . Family History  Problem Relation Age of Onset  . Cancer Other   . Hypertension Other    Family Psychiatric  History:denies history of mental illness or of suicides in family, history of alcohol dependence in extended family  Tobacco Screening: Have you used any form of tobacco in the last 30 days? 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 42.0 oz/week  . 70 Shots of liquor per week    Comment: 2 pints cognac weekly, last drink 3/17     History  Drug Use  . Types: Cocaine, Marijuana    Additional Social History: Marital status: Married Number of Years Married: 61 What types of issues is patient dealing with in the relationship?: Pt's drinking is causing issues in the relationship  Does patient have children?: Yes How many children?: 3 (Sons: 9,7,5) How is patient's relationship with their children?: Pt states that he has a "awesome" relationship with his kids and enjoys spending time with them.    Pain Medications: See PTA meds Prescriptions: See PTA meds Over the Counter: See PTA meds History of alcohol / drug use?: Yes Longest period of sobriety (when/how long): none reported Negative Consequences of Use: Personal relationships, Museum/gallery curator, Scientist, research (physical sciences), Work / School Withdrawal Symptoms: Seizures, Nausea / Vomiting Onset of Seizures: past few years Date of most recent seizure: recently Name of Substance 1: Alcohol 1 - Age of First Use: unknown 1 - Amount (size/oz): 1.5 pint 1 - Frequency: daily 1 - Duration: ongoing 1 - Last Use / Amount: 04/03/17 Name of Substance 2:  Marijuana  2 - Age of First Use: unknown 2 - Amount (size/oz): "blunts" 2 - Frequency: daily 2 - Duration: ongoing 2 - Last Use / Amount: 04/03/17  Allergies:  No Known Allergies Lab Results:  Results for orders placed or performed during the hospital encounter of 04/04/17 (from the past 48 hour(s))  Comprehensive metabolic panel     Status: Abnormal   Collection Time: 04/04/17  2:39 AM  Result Value Ref Range   Sodium 137 135 - 145 mmol/L   Potassium 3.7 3.5 - 5.1 mmol/L   Chloride 103 101 - 111 mmol/L   CO2 22 22 - 32 mmol/L   Glucose, Bld 96 65 - 99 mg/dL   BUN 12 6 - 20 mg/dL   Creatinine, Ser 1.27 (H) 0.61 - 1.24 mg/dL   Calcium 8.8 (L) 8.9 - 10.3 mg/dL   Total Protein 7.3 6.5 - 8.1 g/dL   Albumin 4.4 3.5 - 5.0 g/dL   AST 33 15 - 41 U/L   ALT 26 17 - 63 U/L   Alkaline Phosphatase 72 38 - 126 U/L   Total  Bilirubin 0.3 0.3 - 1.2 mg/dL   GFR calc non Af Amer >60 >60 mL/min   GFR calc Af Amer >60 >60 mL/min    Comment: (NOTE) The eGFR has been calculated using the CKD EPI equation. This calculation has not been validated in all clinical situations. eGFR's persistently <60 mL/min signify possible Chronic Kidney Disease.    Anion gap 12 5 - 15  Ethanol     Status: Abnormal   Collection Time: 04/04/17  2:39 AM  Result Value Ref Range   Alcohol, Ethyl (B) 148 (H) <5 mg/dL    Comment:        LOWEST DETECTABLE LIMIT FOR SERUM ALCOHOL IS 5 mg/dL FOR MEDICAL PURPOSES ONLY   cbc     Status: None   Collection Time: 04/04/17  2:39 AM  Result Value Ref Range   WBC 7.2 4.0 - 10.5 K/uL   RBC 4.78 4.22 - 5.81 MIL/uL   Hemoglobin 15.6 13.0 - 17.0 g/dL   HCT 45.7 39.0 - 52.0 %   MCV 95.6 78.0 - 100.0 fL   MCH 32.6 26.0 - 34.0 pg   MCHC 34.1 30.0 - 36.0 g/dL   RDW 13.3 11.5 - 15.5 %   Platelets 252 150 - 400 K/uL  Rapid urine drug screen (hospital performed)     Status: Abnormal   Collection Time: 04/04/17  2:46 AM  Result Value Ref Range   Opiates NONE DETECTED NONE  DETECTED   Cocaine POSITIVE (A) NONE DETECTED   Benzodiazepines NONE DETECTED NONE DETECTED   Amphetamines NONE DETECTED NONE DETECTED   Tetrahydrocannabinol POSITIVE (A) NONE DETECTED   Barbiturates NONE DETECTED NONE DETECTED    Comment:        DRUG SCREEN FOR MEDICAL PURPOSES ONLY.  IF CONFIRMATION IS NEEDED FOR ANY PURPOSE, NOTIFY LAB WITHIN 5 DAYS.        LOWEST DETECTABLE LIMITS FOR URINE DRUG SCREEN Drug Class       Cutoff (ng/mL) Amphetamine      1000 Barbiturate      200 Benzodiazepine   124 Tricyclics       580 Opiates          300 Cocaine          300 THC              50     Blood Alcohol level:  Lab Results  Component Value Date   ETH 148 (H) 04/04/2017   ETH 9 99/83/3825    Metabolic Disorder Labs:  No results found for: HGBA1C, MPG No results found for: PROLACTIN No results found for: CHOL, TRIG, HDL, CHOLHDL, VLDL, LDLCALC  Current Medications: Current Facility-Administered Medications  Medication Dose Route Frequency Provider Last Rate Last Dose  . acetaminophen (TYLENOL) tablet 650 mg  650 mg Oral Q4H PRN Niel Hummer, NP      . alum & mag hydroxide-simeth (MAALOX/MYLANTA) 200-200-20 MG/5ML suspension 30 mL  30 mL Oral PRN Niel Hummer, NP      . benzocaine (ORAJEL) 10 % mucosal gel   Mouth/Throat TID PRN Laverle Hobby, PA-C      . diclofenac (VOLTAREN) EC tablet 50 mg  50 mg Oral BID Elmarie Shiley A, NP   50 mg at 04/05/17 0840  . DULoxetine (CYMBALTA) DR capsule 20 mg  20 mg Oral BID Laverle Hobby, PA-C   20 mg at 04/05/17 0840  . hydrOXYzine (ATARAX/VISTARIL) tablet 25 mg  25 mg Oral Q6H PRN Gilberto Better,  Maurine Minister, PA-C      . ibuprofen (ADVIL,MOTRIN) tablet 600 mg  600 mg Oral Q6H PRN Patriciaann Clan E, PA-C      . loperamide (IMODIUM) capsule 2-4 mg  2-4 mg Oral PRN Laverle Hobby, PA-C      . LORazepam (ATIVAN) tablet 1 mg  1 mg Oral Q6H PRN Laverle Hobby, PA-C      . methocarbamol (ROBAXIN) tablet 500 mg  500 mg Oral BID Elmarie Shiley A,  NP   500 mg at 04/05/17 0840  . multivitamin with minerals tablet 1 tablet  1 tablet Oral Daily Laverle Hobby, PA-C   1 tablet at 04/05/17 0840  . nicotine (NICODERM CQ - dosed in mg/24 hr) patch 7 mg  7 mg Transdermal Daily Elmarie Shiley A, NP   7 mg at 04/05/17 0840  . ondansetron (ZOFRAN-ODT) disintegrating tablet 4 mg  4 mg Oral Q6H PRN Patriciaann Clan E, PA-C      . thiamine (VITAMIN B-1) tablet 100 mg  100 mg Oral Daily Patriciaann Clan E, PA-C   100 mg at 04/05/17 0840  . topiramate (TOPAMAX) tablet 25 mg  25 mg Oral Daily Patriciaann Clan E, PA-C   25 mg at 04/05/17 0840  . traZODone (DESYREL) tablet 50 mg  50 mg Oral QHS,MR X 1 Simon, Spencer E, PA-C       PTA Medications: Prescriptions Prior to Admission  Medication Sig Dispense Refill Last Dose  . diclofenac (VOLTAREN) 50 MG EC tablet Take 1 tablet (50 mg total) by mouth 2 (two) times daily. (Patient taking differently: Take 50 mg by mouth 2 (two) times daily as needed for mild pain. ) 15 tablet 0 Past Week at Unknown time  . methocarbamol (ROBAXIN) 500 MG tablet Take 1 tablet (500 mg total) by mouth 2 (two) times daily. (Patient taking differently: Take 500 mg by mouth 2 (two) times daily as needed for muscle spasms. ) 20 tablet 0 Past Week at Unknown time  . Multiple Vitamin (MULTIVITAMIN WITH MINERALS) TABS tablet Take 1 tablet by mouth daily.   Past Week at Unknown time    Musculoskeletal: Strength & Muscle Tone: within normal limits - minimal distal tremors, no diaphoresis, no restlessness  Gait & Station: normal Patient leans: N/A  Psychiatric Specialty Exam: Physical Exam  Review of Systems  Constitutional: Negative.   HENT: Negative.   Eyes: Negative.   Respiratory: Negative.   Cardiovascular: Negative.   Gastrointestinal: Negative.   Genitourinary: Negative.   Musculoskeletal: Negative.   Skin: Negative.   Neurological: Positive for seizures.       History of withdrawal seizure several years ago   Endo/Heme/Allergies: Negative.   Psychiatric/Behavioral: Positive for depression and substance abuse.  All other systems reviewed and are negative.   Blood pressure 128/83, pulse 64, temperature 97.8 F (36.6 C), temperature source Oral, resp. rate 16, height 6' 1.5" (1.867 m), weight 86.9 kg (191 lb 8 oz), SpO2 100 %.Body mass index is 24.92 kg/m.  General Appearance: Well Groomed  Eye Contact:  Good  Speech:  Normal Rate  Volume:  Normal  Mood:  depressed, but states feeling better today  Affect:  appropriate, reactive, tearful at times   Thought Process:  Linear and Descriptions of Associations: Intact  Orientation:  Full (Time, Place, and Person)  Thought Content:  no hallucinations, no delusions, not internally preoccupied   Suicidal Thoughts:  No denies any suicidal or self injurious ideations , denies any HI  Homicidal  Thoughts:  No  Memory:  recent and remote grossly intact   Judgement:  Fair  Insight:  Present  Psychomotor Activity:  Normal- no current symptoms of acute Alcohol WDL   Concentration:  Concentration: Good and Attention Span: Good  Recall:  Good  Fund of Knowledge:  Good  Language:  Good  Akathisia:  Negative  Handed:  Right  AIMS (if indicated):     Assets:  Communication Skills Desire for Improvement Resilience Social Support Talents/Skills  ADL's:  Intact  Cognition:  WNL  Sleep:  Number of Hours: 5.5    Treatment Plan Summary: Daily contact with patient to assess and evaluate symptoms and progress in treatment, Medication management, Plan inpatient treatment  and medications as below   Observation Level/Precautions:  15 minute checks  Laboratory:  as needed   Psychotherapy:  Milieu, support , groups   Medications:    Cymbalta for depression, currently at 20 mgrs BID  Ativan detox protocol - currently at PRN , but will change to standing , due to history of withdrawal seizures in the past    Will D/C Topamax, as no clear indication  /possible side effects.  Agrees to start Campral 666 mgrs TID for alcohol dependence   Consultations:  As needed   Discharge Concerns: -    Estimated LOS:5-6 days   Other:     Physician Treatment Plan for Primary Diagnosis:  MDD  Long Term Goal(s): Improvement in symptoms so as ready for discharge  Short Term Goals: Ability to verbalize feelings will improve, Ability to disclose and discuss suicidal ideas, Ability to demonstrate self-control will improve, Ability to identify and develop effective coping behaviors will improve and Ability to maintain clinical measurements within normal limits will improve  Physician Treatment Plan for Secondary Diagnosis: Alcohol Use Disorder  Long Term Goal(s): Improvement in symptoms so as ready for discharge  Short Term Goals: Ability to identify triggers associated with substance abuse/mental health issues will improve  I certify that inpatient services furnished can reasonably be expected to improve the patient's condition.    Jenne Campus, MD 5/15/201812:19 PM

## 2017-04-05 NOTE — [facility] Group Notes (Signed)
[facility] LCSW Group Therapy 04/05/2017 1:15 PM  Type of Therapy: Group Therapy- Feelings about Diagnosis  Participation Level: Active   Participation Quality:  Appropriate  Affect:  Appropriate  Cognitive: Alert and Oriented   Insight:  Developing   Engagement in Therapy: Developing/Improving and Engaged   Modes of Intervention: Clarification, Confrontation, Discussion, Education, Exploration, Limit-setting, Orientation, Problem-solving, Rapport Building, Dance movement psychotherapisteality Testing, Socialization and Support  Description of Group:   This group will allow patients to explore their thoughts and feelings about diagnoses they have received. Patients will be guided to explore their level of understanding and acceptance of these diagnoses. Facilitator will encourage patients to process their thoughts and feelings about the reactions of others to their diagnosis, and will guide patients in identifying ways to discuss their diagnosis with significant others in their lives. This group will be process-oriented, with patients participating in exploration of their own experiences as well as giving and receiving support and challenge from other group members.  Summary of Progress/Problems:  Pt came during the end of group. Pt reports that his family has been very supportive of his mental health diagnosis and struggles with substance use. Pt states that in the past he had people in his life that weren't giving him the support that he needed and he cut him out of his life. Pt stated "When it comes to support it's more about quality than quantity".  Therapeutic Modalities:   Cognitive Behavioral Therapy Solution Focused Therapy Motivational Interviewing Relapse Prevention Therapy  Jonathon JordanLynn B Jillienne Egner, MSW, LCSWA 04/05/2017 4:18 PM

## 2017-04-05 NOTE — Progress Notes (Addendum)
Patient ID: Leonard AngerRobert D [name], male   DOB: 1981-03-16, 36 y.o.   MRN: [id number] D: Client's wife visits tonight.  Client reports of his day with wife visiting "got a lot better" Client reports he has been married for 17 years half of his life. "It ain't [place] out there in them streets"  A: Clinical research associateWriter provided emotional support, encouraged client to report any concerns. Medications reviewed, administered as ordered. Staff will monitor q4615min for safety. R: Client is safe on the unit.

## 2017-04-05 NOTE — Plan of Care (Signed)
Problem: Safety: Goal: Periods of time without injury will increase Outcome: Progressing Periods of time without injury will increase AEB q1615min safety checks, contracts for safety.

## 2017-04-05 NOTE — Progress Notes (Signed)
Recreation Therapy Notes  Animal-Assisted Activity (AAA) Program Checklist/Progress Notes Patient Eligibility Criteria Checklist & Daily Group note for Rec TxIntervention  Date: 05.15.2018 Time: 2:45pm [facility]    AAA/T Program Assumption of Risk Form signed by Patient/ or Parent Legal Guardian Yes  Patient is free of allergies or sever asthma Yes  Patient reports no fear of animals Yes  Patient reports no history of cruelty to animals Yes  Patient understands his/her participation is voluntary Yes  Behavioral Response: Did not attend. Patient declined pet therapy services at admission.    Marykay Lexenise L Mendy Chou, LRT/CTRS        Jearl KlinefelterBlanchfield, Auriah Hollings L 04/05/2017 2:58 PM

## 2017-04-06 NOTE — Progress Notes (Signed)
[facility] MD Progress Note  04/06/2017 12:57 PM Leonard [name]  MRN:  [id number] Subjective:  Patient states he is feeling " a little better today" States he had good visit with wife yesterday, and reports he has good support system. He denies any severe alcohol WDL symptoms at this time. Objective : I have discussed case with treatment team and have met with patient. Currently presenting with partially improved mood , less severely depressed . Affect still constricted but more reactive, and smiles at times appropriately. Denies any active SI, contracts for safety on unit  Minimal distal tremors, no diaphoresis, no acute distress or psychomotor restlessness, denies visual disturbances or headache at this time. Vitals stable. No medication side effects. No disruptive or agitated behaviors on unit.Going to some groups  Principal Problem: MDD (major depressive disorder), single episode Diagnosis:   Patient Active Problem List   Diagnosis Date Noted  . MDD (major depressive disorder), single episode [F32.9] 04/04/2017   Total Time spent with patient: 20 minutes  Past Medical History:  Past Medical History:  Diagnosis Date  . Alcohol abuse   . Seizures ([place])    while detoxing   History reviewed. No pertinent surgical history. Family History:  Family History  Problem Relation Age of Onset  . Cancer Other   . Hypertension Other    Social History:  History  Alcohol Use  . 42.0 oz/week  . 70 Shots of liquor per week    Comment: 2 pints cognac weekly, last drink 3/17     History  Drug Use  . Types: Cocaine, Marijuana    Social History   Social History  . Marital status: Legally Separated    Spouse name: N/A  . Number of children: N/A  . Years of education: N/A   Social History Main Topics  . Smoking status: Current Some Day Smoker    Packs/day: 0.25    Types: Cigarettes  . Smokeless tobacco: Never Used  . Alcohol use 42.0 oz/week    70 Shots of liquor per week      Comment: 2 pints cognac weekly, last drink 3/17  . Drug use: Yes    Types: Cocaine, Marijuana  . Sexual activity: Yes   Other Topics Concern  . None   Social History Narrative  . None   Additional Social History:    Pain Medications: See PTA meds Prescriptions: See PTA meds Over the Counter: See PTA meds History of alcohol / drug use?: Yes Longest period of sobriety (when/how long): none reported Negative Consequences of Use: Personal relationships, Financial, Scientist, research (physical sciences), Work / School Withdrawal Symptoms: Seizures, Nausea / Vomiting Onset of Seizures: past few years Date of most recent seizure: recently Name of Substance 1: Alcohol 1 - Age of First Use: unknown 1 - Amount (size/oz): 1.5 pint 1 - Frequency: daily 1 - Duration: ongoing 1 - Last Use / Amount: 04/03/17 Name of Substance 2: Marijuana  2 - Age of First Use: unknown 2 - Amount (size/oz): "blunts" 2 - Frequency: daily 2 - Duration: ongoing 2 - Last Use / Amount: 04/03/17  Sleep: Fair  Appetite:  Good  Current Medications: Current Facility-Administered Medications  Medication Dose Route Frequency Provider Last Rate Last Dose  . acamprosate (CAMPRAL) tablet 666 mg  666 mg Oral TID WC Osmar Howton, Myer Peer, MD   666 mg at 04/06/17 1217  . acetaminophen (TYLENOL) tablet 650 mg  650 mg Oral Q4H PRN Niel Hummer, NP      . alum &  mag hydroxide-simeth (MAALOX/MYLANTA) 200-200-20 MG/5ML suspension 30 mL  30 mL Oral PRN Niel Hummer, NP      . benzocaine (ORAJEL) 10 % mucosal gel   Mouth/Throat TID PRN Laverle Hobby, PA-C      . diclofenac (VOLTAREN) EC tablet 50 mg  50 mg Oral BID Elmarie Shiley A, NP   50 mg at 04/06/17 6440  . DULoxetine (CYMBALTA) DR capsule 20 mg  20 mg Oral BID Laverle Hobby, PA-C   20 mg at 04/06/17 3474  . hydrOXYzine (ATARAX/VISTARIL) tablet 25 mg  25 mg Oral Q6H PRN Emmerson Shuffield, Myer Peer, MD      . ibuprofen (ADVIL,MOTRIN) tablet 600 mg  600 mg Oral Q6H PRN Laverle Hobby, PA-C      .  loperamide (IMODIUM) capsule 2-4 mg  2-4 mg Oral PRN Tashima Scarpulla, Myer Peer, MD      . LORazepam (ATIVAN) tablet 1 mg  1 mg Oral Q6H PRN Tammey Deeg, Myer Peer, MD      . LORazepam (ATIVAN) tablet 1 mg  1 mg Oral TID Demarquis Osley, Myer Peer, MD   1 mg at 04/06/17 1217   Followed by  . [START ON 04/07/2017] LORazepam (ATIVAN) tablet 1 mg  1 mg Oral BID Gladies Sofranko, Myer Peer, MD       Followed by  . [START ON 04/09/2017] LORazepam (ATIVAN) tablet 1 mg  1 mg Oral Daily Caralee Morea A, MD      . methocarbamol (ROBAXIN) tablet 500 mg  500 mg Oral Q8H PRN Kaizlee Carlino, Myer Peer, MD   500 mg at 04/06/17 0829  . multivitamin with minerals tablet 1 tablet  1 tablet Oral Daily Savien Mamula, Myer Peer, MD   1 tablet at 04/06/17 [phone]  . nicotine (NICODERM CQ - dosed in mg/24 hr) patch 7 mg  7 mg Transdermal Daily Elmarie Shiley A, NP   7 mg at 04/06/17 0830  . ondansetron (ZOFRAN-ODT) disintegrating tablet 4 mg  4 mg Oral Q6H PRN Jadrian Bulman A, MD      . thiamine (VITAMIN B-1) tablet 100 mg  100 mg Oral Daily Shirline Kendle, Myer Peer, MD   100 mg at 04/06/17 0828  . traZODone (DESYREL) tablet 50 mg  50 mg Oral QHS PRN Gordon Carlson, Myer Peer, MD        Lab Results: No results found for this or any previous visit (from the past 48 hour(s)).  Blood Alcohol level:  Lab Results  Component Value Date   ETH 148 (H) 04/04/2017   ETH 9 63/87/5643    Metabolic Disorder Labs: No results found for: HGBA1C, MPG No results found for: PROLACTIN No results found for: CHOL, TRIG, HDL, CHOLHDL, VLDL, LDLCALC  Physical Findings: AIMS: Facial and Oral Movements Muscles of Facial Expression: None, normal Lips and Perioral Area: None, normal Jaw: None, normal Tongue: None, normal,Extremity Movements Upper (arms, wrists, hands, fingers): None, normal Lower (legs, knees, ankles, toes): None, normal, Trunk Movements Neck, shoulders, hips: None, normal, Overall Severity Severity of abnormal movements (highest score from questions above): None,  normal Incapacitation due to abnormal movements: None, normal Patient's awareness of abnormal movements (rate only patient's report): No Awareness, Dental Status Current problems with teeth and/or dentures?: No Does patient usually wear dentures?: No  CIWA:  CIWA-Ar Total: 2 COWS:     Musculoskeletal: Strength & Muscle Tone: within normal limits- minimal distal tremors, no diaphoresis, no restlessness  Gait & Station: normal Patient leans: N/A  Psychiatric Specialty Exam: Physical Exam  ROS  denies headache or visual disturbances, no vomiting  Blood pressure (!) 141/90, pulse 64, temperature 97.9 F (36.6 C), temperature source Oral, resp. rate 16, height 6' 1.5" (1.867 m), weight 86.9 kg (191 lb 8 oz), SpO2 100 %.Body mass index is 24.92 kg/m.  General Appearance: Fairly Groomed  Eye Contact:  Good  Speech:  Normal Rate  Volume:  Decreased  Mood:  improving , less depressed   Affect:  milldy constricted but smiles appropriately at times   Thought Process:  Linear and Descriptions of Associations: Intact  Orientation:  Full (Time, Place, and Person)  Thought Content:  no hallucinations, no delusions, not internally preoccupied   Suicidal Thoughts:  No denies suicidal or self injurious ideations, contracts for safety on unit   Homicidal Thoughts:  No  Memory:  recent and remote grossly intact   Judgement:  Other:  improving   Insight:  improving  Psychomotor Activity:  Normal  Concentration:  Concentration: Good and Attention Span: Good  Recall:  Good  Fund of Knowledge:  Good  Language:  Good  Akathisia:  NA  Handed:  Right  AIMS (if indicated):     Assets:  Communication Skills Desire for Improvement Social Support Talents/Skills  ADL's:  Intact  Cognition:  WNL  Sleep:  Number of Hours: 6   Assessment- patient presenting with partial improvement- less depressed, reactive affect, no SI . No severe symptoms of alcohol WDL at this time- thus far tolerating Ativan detox  protocol well , Cymbalta trial well .   Treatment Plan  Summary: Daily contact with patient to assess and evaluate symptoms and progress in treatment, Medication management, Plan inpatient treatment  and medications as below Encourage ongoing group and milieu participation to work on coping skills and symptom reduction Encourage ongoing efforts to work on sobriety and relapse prevention Treatment team working on disposition planning options  Continue Cymbalta 20 mgrs BID for depression, anxiety Continue Campral 666 mgrs TID for alcohol cravings/desires to consume ETOH  Continue Ativan detox protocol to minimize risk of ETOH withdrawal Continue Trazodone 50 mgrs QHS PRN for insomnia Jenne Campus, MD 04/06/2017, 12:57 PM

## 2017-04-06 NOTE — [facility] Group Notes (Signed)
[facility] LCSW Group Therapy 04/06/2017 1:15 PM  Type of Therapy: Group Therapy- Emotion Regulation  Participation Level: Pt invited. Did not attend.   Jonathon JordanLynn B Keviana Guida, MSW, LCSWA 04/06/2017 4:48 PM

## 2017-04-06 NOTE — Progress Notes (Signed)
  DATA ACTION RESPONSE  Objective- Pt. is visible in the dayroom, seen interacting with peers and talking on the phone.   Presents with a depressed affect and mood. Pt. appears motivated for treatment and was appropriate with interaction.  Subjective- Denies having any SI/HI/AVH at this time. Rates pain 6/10; L shoulder. Pt. states " I got to do this for me so I can take care of my mother". Is cooperative and remain safe on the unit.  1:1 interaction in private to establish rapport. Encouragement, education, & support given from staff. PRN Vistaril, Ibuprofen, Robaxin   requested and will re-eval accordingly.   Safety maintained with Q 15 checks. Continues to follow treatment plan and will monitor closely. No additonal questions/concerns noted.

## 2017-04-06 NOTE — Progress Notes (Signed)
Recreation Therapy Notes  Date: 04/06/17 Time: 0930 Location: [address] Dayroom  Group Topic: Stress Management  Goal Area(s) Addresses:  Patient will verbalize importance of using healthy stress management.  Patient will identify positive emotions associated with healthy stress management.   Intervention: Stress Management  Activity :  Body Scan Meditation.  LRT introduced the stress management technique of meditation.  LRT played a meditation from the Calm app to allow patients the opportunity to scan and acknowledge the sensations and tension within the body.  Patients were to follow along as the meditation played to engage in the activity.  Education:  Stress Management, Discharge Planning.   Education Outcome: Acknowledges edcuation/In group clarification offered/Needs additional education  Clinical Observations/Feedback: Pt did not attend group.   Rector Devonshire, LRT/CTRS         Danel Requena A 04/06/2017 12:55 PM 

## 2017-04-06 NOTE — Progress Notes (Signed)
Adult Psychoeducational Group Note  Date:  04/06/2017 Time:  9:45 PM  Group Topic/Focus:  Wrap-Up Group:   The focus of this group is to help patients review their daily goal of treatment and discuss progress on daily workbooks.  Participation Level:  Active  Participation Quality:  Appropriate and Attentive  Affect:  Appropriate  Cognitive:  Appropriate  Insight: Appropriate  Engagement in Group:  Engaged  Modes of Intervention:  Discussion  Additional Comments:  Pt stated his goal was to focus on his discharge plan. Pt states he has been feeling better, had a great experience here with the staff so far. Pt identified 2 coping skills, 1) prayer, 2) communicating with my circle.  Caswell CorwinOwen, Pearlena Ow C 04/06/2017, 9:45 PM

## 2017-04-06 NOTE — Progress Notes (Signed)
Patient ID: Leonard AngerRobert D Hammad, male   DOB: [birth year], 36 y.o.   MRN: [id number]  DAR: Pt. Denies SI/HI and A/V Hallucinations. He reports sleep is fair, appetite is fair, energy level is normal, and concentration is good. He rates depression 3/10, hopelessness 0/10, and anxiety 2/10. Patient does report some left shoulder pain and received medication for this. Support and encouragement provided to the patient. However, patient is minimal and withdrawn from this Clinical research associatewriter. He reports some sedation from the Ativan and irritability from withdrawal. Patient is seen in the milieu minimally. He reports, "it's good to dream again" when writer inquired about his sleeping throughout the day. Q15 minute checks are maintained for safety.

## 2017-04-07 NOTE — [facility] Group Notes (Signed)
[facility] Group Notes:  (Nursing/MHT/Case Management/Adjunct)  Date:  04/07/2017  Time:  4:55 PM  Type of Therapy:  Psychoeducational Skills  Participation Level:  Did Not Attend  Participation Quality:  N/A  Affect:  N/A  Cognitive:  N/A  Insight:  None  Engagement in Group:  None  Modes of Intervention:  Discussion and Education  Summary of Progress/Problems: Patient was invited to group but did not attend. Marzetta BoardDopson, Vivianne Carles E 04/07/2017, 4:55 PM

## 2017-04-07 NOTE — Progress Notes (Signed)
[facility] MD Progress Note  04/07/2017 6:01 PM Leonard [name]  MRN:  [id number] Subjective:   Patient states he is feeling much better today. States " I feel the best I have in a long time", and attributes this partly to supportive , friendly  interactions with peers, staff . Denies medication side effects. Denies any symptoms of withdrawal. At this time denies cravings.  Objective : I have discussed case with treatment team and have met with patient. Patient presents much improved at this time. Denies feeling depressed, and presents euthymic, with full range of affect. No current tremors , diaphoresis, or psychomotor agitation . Denies any suicidal ideations. Visible in day room, interacting appropriately with peers, pleasant on approach. Denies medication side effects.   Principal Problem: MDD (major depressive disorder), single episode Diagnosis:   Patient Active Problem List   Diagnosis Date Noted  . MDD (major depressive disorder), single episode [F32.9] 04/04/2017   Total Time spent with patient: 20 minutes  Past Medical History:  Past Medical History:  Diagnosis Date  . Alcohol abuse   . Seizures ([place])    while detoxing   History reviewed. No pertinent surgical history. Family History:  Family History  Problem Relation Age of Onset  . Cancer Other   . Hypertension Other    Social History:  History  Alcohol Use  . 42.0 oz/week  . 70 Shots of liquor per week    Comment: 2 pints cognac weekly, last drink 3/17     History  Drug Use  . Types: Cocaine, Marijuana    Social History   Social History  . Marital status: Legally Separated    Spouse name: N/A  . Number of children: N/A  . Years of education: N/A   Social History Main Topics  . Smoking status: Current Some Day Smoker    Packs/day: 0.25    Types: Cigarettes  . Smokeless tobacco: Never Used  . Alcohol use 42.0 oz/week    70 Shots of liquor per week     Comment: 2 pints cognac weekly, last drink 3/17   . Drug use: Yes    Types: Cocaine, Marijuana  . Sexual activity: Yes   Other Topics Concern  . None   Social History Narrative  . None   Additional Social History:    Pain Medications: See PTA meds Prescriptions: See PTA meds Over the Counter: See PTA meds History of alcohol / drug use?: Yes Longest period of sobriety (when/how long): none reported Negative Consequences of Use: Personal relationships, Financial, Scientist, research (physical sciences), Work / School Withdrawal Symptoms: Seizures, Nausea / Vomiting Onset of Seizures: past few years Date of most recent seizure: recently Name of Substance 1: Alcohol 1 - Age of First Use: unknown 1 - Amount (size/oz): 1.5 pint 1 - Frequency: daily 1 - Duration: ongoing 1 - Last Use / Amount: 04/03/17 Name of Substance 2: Marijuana  2 - Age of First Use: unknown 2 - Amount (size/oz): "blunts" 2 - Frequency: daily 2 - Duration: ongoing 2 - Last Use / Amount: 04/03/17  Sleep: improved  Appetite:  Good  Current Medications: Current Facility-Administered Medications  Medication Dose Route Frequency Provider Last Rate Last Dose  . acamprosate (CAMPRAL) tablet 666 mg  666 mg Oral TID WC Liylah Najarro, Myer Peer, MD   666 mg at 04/07/17 1218  . acetaminophen (TYLENOL) tablet 650 mg  650 mg Oral Q4H PRN Niel Hummer, NP      . alum & mag hydroxide-simeth (MAALOX/MYLANTA) 200-200-20 MG/5ML  suspension 30 mL  30 mL Oral PRN Niel Hummer, NP      . benzocaine (ORAJEL) 10 % mucosal gel   Mouth/Throat TID PRN Laverle Hobby, PA-C      . diclofenac (VOLTAREN) EC tablet 50 mg  50 mg Oral BID Elmarie Shiley A, NP   50 mg at 04/07/17 0759  . DULoxetine (CYMBALTA) DR capsule 20 mg  20 mg Oral BID Laverle Hobby, PA-C   20 mg at 04/07/17 0759  . hydrOXYzine (ATARAX/VISTARIL) tablet 25 mg  25 mg Oral Q6H PRN Tine Mabee, Myer Peer, MD   25 mg at 04/06/17 2146  . ibuprofen (ADVIL,MOTRIN) tablet 600 mg  600 mg Oral Q6H PRN Laverle Hobby, PA-C   600 mg at 04/06/17 2146  .  loperamide (IMODIUM) capsule 2-4 mg  2-4 mg Oral PRN Yamen Castrogiovanni, Myer Peer, MD      . LORazepam (ATIVAN) tablet 1 mg  1 mg Oral Q6H PRN Renaldo Gornick, Myer Peer, MD      . LORazepam (ATIVAN) tablet 1 mg  1 mg Oral BID Anavey Coombes, Myer Peer, MD       Followed by  . [START ON 04/09/2017] LORazepam (ATIVAN) tablet 1 mg  1 mg Oral Daily Janene Yousuf A, MD      . methocarbamol (ROBAXIN) tablet 500 mg  500 mg Oral Q8H PRN Bret Stamour, Myer Peer, MD   500 mg at 04/06/17 2146  . multivitamin with minerals tablet 1 tablet  1 tablet Oral Daily Virgen Belland, Myer Peer, MD   1 tablet at 04/07/17 0759  . nicotine (NICODERM CQ - dosed in mg/24 hr) patch 7 mg  7 mg Transdermal Daily Elmarie Shiley A, NP   7 mg at 04/07/17 0800  . ondansetron (ZOFRAN-ODT) disintegrating tablet 4 mg  4 mg Oral Q6H PRN Alphonsa Brickle A, MD      . thiamine (VITAMIN B-1) tablet 100 mg  100 mg Oral Daily Analilia Geddis, Myer Peer, MD   100 mg at 04/07/17 0759  . traZODone (DESYREL) tablet 50 mg  50 mg Oral QHS PRN Harleyquinn Gasser, Myer Peer, MD        Lab Results: No results found for this or any previous visit (from the past 48 hour(s)).  Blood Alcohol level:  Lab Results  Component Value Date   ETH 148 (H) 04/04/2017   ETH 9 15/52/0802    Metabolic Disorder Labs: No results found for: HGBA1C, MPG No results found for: PROLACTIN No results found for: CHOL, TRIG, HDL, CHOLHDL, VLDL, LDLCALC  Physical Findings: AIMS: Facial and Oral Movements Muscles of Facial Expression: None, normal Lips and Perioral Area: None, normal Jaw: None, normal Tongue: None, normal,Extremity Movements Upper (arms, wrists, hands, fingers): None, normal Lower (legs, knees, ankles, toes): None, normal, Trunk Movements Neck, shoulders, hips: None, normal, Overall Severity Severity of abnormal movements (highest score from questions above): None, normal Incapacitation due to abnormal movements: None, normal Patient's awareness of abnormal movements (rate only patient's report): No  Awareness, Dental Status Current problems with teeth and/or dentures?: No Does patient usually wear dentures?: No  CIWA:  CIWA-Ar Total: 1 COWS:     Musculoskeletal: Strength & Muscle Tone: within normal limits- no distal tremors, no diaphoresis, no restlessness  Gait & Station: normal Patient leans: N/A  Psychiatric Specialty Exam: Physical Exam  ROS denies headache or visual disturbances, no vomiting  Blood pressure 128/80, pulse 60, temperature 97.4 F (36.3 C), temperature source Oral, resp. rate 18, height 6' 1.5" (1.867 m),  weight 86.9 kg (191 lb 8 oz), SpO2 100 %.Body mass index is 24.92 kg/m.  General Appearance: Well Groomed  Eye Contact:  Good  Speech:  Normal Rate  Volume:  Normal  Mood:  improved, currently euthymic   Affect:  Appropriate and Full Range  Thought Process:  Goal Directed and Descriptions of Associations: Intact  Orientation:  Full (Time, Place, and Person)  Thought Content:  no hallucinations, no delusions   Suicidal Thoughts:  No denies suicidal or self injurious ideations, contracts for safety on unit   Homicidal Thoughts:  No  Memory:  recent and remote grossly intact   Judgement:  Other:  improved   Insight:  improved   Psychomotor Activity:  Normal  Concentration:  Concentration: Good and Attention Span: Good  Recall:  Good  Fund of Knowledge:  Good  Language:  Good  Akathisia:  No  Handed:  Right  AIMS (if indicated):     Assets:  Communication Skills Desire for Improvement Physical Health Social Support Talents/Skills  ADL's:  Intact  Cognition:  WNL  Sleep:  Number of Hours: 5.75   Assessment- today patient presents improved, currently euthymic, with a fuller range of affect. Denies symptoms of alcohol WDL, and does not appear to be in any acute distress or discomfort - vitals are stable. Denies medication side effects. Tolerating Cymbalta and Campral well .    Treatment Plan  Summary: Treatment plan reviewed as below today 5/17.   Daily contact with patient to assess and evaluate symptoms and progress in treatment, Medication management, Plan inpatient treatment  and medications as below Encourage ongoing group and milieu participation to work on coping skills and symptom reduction Encourage ongoing efforts to work on sobriety and relapse prevention Treatment team working on disposition planning options  Continue Cymbalta 20 mgrs BID for depression, anxiety Continue Campral 666 mgrs TID for alcohol cravings/desires to consume ETOH  Continue Ativan detox protocol to minimize risk of ETOH withdrawal Continue Trazodone 50 mgrs QHS PRN for insomnia Jenne Campus, MD 04/07/2017, 6:01 PM   Patient ID: Rejeana Brock, male   DOB: [birth year], 36 y.o.   MRN: [id number]

## 2017-04-07 NOTE — Progress Notes (Signed)
Nutrition Education Note  Pt attended group focusing on general, healthful nutrition education.  RD emphasized the importance of eating regular meals and snacks throughout the day. Consuming sugar-free beverages and incorporating fruits and vegetables into diet when possible. Provided examples of healthy snacks. Patient encouraged to leave group with a goal to improve nutrition/healthy eating.   Diet Order: Diet regular Room service appropriate? Yes; Fluid consistency: Thin Pt is also offered choice of unit snacks mid-morning and mid-afternoon.  Pt is eating as desired.   If additional nutrition issues arise, please consult RD.    Katina Remick, MS, RD, LDN, CNSC Inpatient Clinical Dietitian Pager # [phone] After hours/weekend pager # [phone]     

## 2017-04-07 NOTE — Progress Notes (Signed)
D: Pt was in the hallway upon initial approach.  Pt presents with anxious affect and mood.  His goal today was "to work on discharge."  Pt reports he is discharging tomorrow and he feels safe to do so.  He plans to seek outpatient treatment and "go back to work."  Pt denies SI/HI, denies hallucinations, reports left shoulder pain of 7/10.  Pt has been visible in milieu interacting with peers and staff appropriately.  Pt attended and participated in evening karaoke group.    A: Introduced self to pt.  Actively listened to pt and offered support and encouragement. PRN medication administered for pain and anxiety.  Q15 minute safety checks maintained.  R: Pt is safe on the unit.  Pt is compliant with medications.  Pt verbally contracts for safety.  Will continue to monitor and assess.

## 2017-04-07 NOTE — Plan of Care (Signed)
Problem: Activity: Goal: Interest or engagement in activities will improve Outcome: Progressing Pt attended and participated in evening karaoke group tonight.

## 2017-04-07 NOTE — [facility] Suicide Risk Assessment (Signed)
[facility] INPATIENT:  Family/Significant Other Suicide Prevention Education  Suicide Prevention Education:  Contact Attempts: Lora HavensKasie Hunt-Sones (wife [phone]), has been identified by the patient as the family member/significant other with whom the patient will be residing, and identified as the person(s) who will aid the patient in the event of a mental health crisis.  With written consent from the patient, two attempts were made to provide suicide prevention education, prior to and/or following the patient's discharge.  We were unsuccessful in providing suicide prevention education.  A suicide education pamphlet was given to the patient to share with family/significant other.  Date and time of first attempt: 04/07/17 @ 10:53 AM. No answer, CSW left a message.  Jonathon JordanLynn B Tenley Winward, MSW, LCSWA  04/07/2017, 10:54 AM

## 2017-04-07 NOTE — Progress Notes (Signed)
Patient ID: Leonard AngerRobert D Erskin, male   DOB: 12-09-1980, 36 y.o.   MRN: [id number]  DAR: Pt. Denies SI/HI and A/V Hallucinations. He reports sleep is fair, appetite is fair, energy level is normal, and concentration is good. He rates depression 2/10, hopelessness 0/10, and anxiety 1/10. Patient does report some pain and received PRN and scheduled medications. Support and encouragement provided to the patient. Patient is seen in the milieu and interacting with peers. He does report some irritability but is more pleasant than yesterday. Q15 minute checks are maintained for safety.

## 2017-04-07 NOTE — [facility] Group Notes (Signed)
[facility] LCSW Group Therapy 04/07/2017 1:15pm  Type of Therapy: Group Therapy- Balance in Life  Participation Level: Active   Description of the Group:  The topic for group was balance in life. Today's group focused on defining balance in one's own words, identifying things that can knock one off balance, and exploring healthy ways to maintain balance in life. Group members were asked to provide an example of a time when they felt off balance, describe how they handled that situation,and process healthier ways to regain balance in the future. Group members were asked to share the most important tool for maintaining balance that [facility] and how they plan to apply this method after discharge.  Summary of Patient Progress  Pt states that he is feeling balanced today and is looking forward to discharge tomorrow. Pt reports that his thinking is something that he needed to work on and once he changed his thinking he was able to live a more positive lifestyle. Pt reports that he finds happiness in little things such as eating banana nut muffins for breakfast.    Therapeutic Modalities:   Cognitive Behavioral Therapy Solution-Focused Therapy Assertiveness Training   Jonathon JordanLynn B Adeline Petitfrere, MSW, LCSWA 04/07/2017 3:33 PM

## 2017-04-07 NOTE — [facility] Suicide Risk Assessment (Signed)
[facility] INPATIENT:  Family/Significant Other Suicide Prevention Education  Suicide Prevention Education:  Education Completed; Leonard [name] (wife [phone]), has been identified by the patient as the family member/significant other with whom the patient will be residing, and identified as the person(s) who will aid the patient in the event of a mental health crisis (suicidal ideations/suicide attempt).  With written consent from the patient, the family member/significant other has been provided the following suicide prevention education, prior to the and/or following the discharge of the patient.  The suicide prevention education provided includes the following:  Suicide risk factors  Suicide prevention and interventions  National Suicide Hotline telephone number  [facility] assessment telephone number  [facility] Emergency Assistance 911  [facility] and/or [facility] telephone number  Request made of family/significant other to:  Remove weapons (e.g., guns, rifles, knives), all items previously/currently identified as safety concern.    Remove drugs/medications (over-the-counter, prescriptions, illicit drugs), all items previously/currently identified as a safety concern.  The family member/significant other verbalizes understanding of the suicide prevention education information provided.  The family member/significant other agrees to remove the items of safety concern listed above.  [name] states that she has no concerns about pt discharging tomorrow. [name] states that she believes pt is doing much better and is ready to come home.  Leonard JordanLynn B Yolando [name], MSW, LCSWA 04/07/2017, 11:17 AM

## 2017-04-08 MED ORDER — HYDROXYZINE HCL 25 MG PO TABS: 25.0000 mg | ORAL_TABLET | Freq: Four times a day (QID) | ORAL | 0 refills | Status: DC | PRN

## 2017-04-08 MED ORDER — DULOXETINE HCL 20 MG PO CPEP: 20.0000 mg | ORAL_CAPSULE | Freq: Two times a day (BID) | ORAL | 0 refills | Status: DC

## 2017-04-08 MED ORDER — TRAZODONE HCL 50 MG PO TABS: 50.0000 mg | ORAL_TABLET | Freq: Every evening | ORAL | 0 refills | Status: DC | PRN

## 2017-04-08 MED ORDER — ACAMPROSATE CALCIUM 333 MG PO TBEC: 666.0000 mg | DELAYED_RELEASE_TABLET | Freq: Three times a day (TID) | ORAL | 0 refills | Status: DC

## 2017-04-08 MED ORDER — DICLOFENAC SODIUM 50 MG PO TBEC: 50.0000 mg | DELAYED_RELEASE_TABLET | Freq: Two times a day (BID) | ORAL | 0 refills | Status: DC

## 2017-04-08 MED ORDER — NICOTINE 7 MG/24HR TD PT24: 7.0000 mg | MEDICATED_PATCH | Freq: Every day | TRANSDERMAL | 0 refills | Status: DC

## 2017-04-08 NOTE — Tx Team (Signed)
Interdisciplinary Treatment and Diagnostic Plan Update 04/08/2017 Time of Session: 9:30am  Leonard [name]  MRN: [id number]  Principal Diagnosis: MDD (major depressive disorder), single episode  Secondary Diagnoses: Principal Problem:   MDD (major depressive disorder), single episode   Current Medications:  Current Facility-Administered Medications  Medication Dose Route Frequency Provider Last Rate Last Dose  . acamprosate (CAMPRAL) tablet 666 mg  666 mg Oral TID WC Cobos, Rockey Situ, MD   666 mg at 04/08/17 0612  . acetaminophen (TYLENOL) tablet 650 mg  650 mg Oral Q4H PRN Thermon Leyland, NP   650 mg at 04/07/17 2335  . alum & mag hydroxide-simeth (MAALOX/MYLANTA) 200-200-20 MG/5ML suspension 30 mL  30 mL Oral PRN Thermon Leyland, NP      . benzocaine (ORAJEL) 10 % mucosal gel   Mouth/Throat TID PRN Kerry Hough, PA-C      . diclofenac (VOLTAREN) EC tablet 50 mg  50 mg Oral BID Fransisca Kaufmann A, NP   50 mg at 04/08/17 0807  . DULoxetine (CYMBALTA) DR capsule 20 mg  20 mg Oral BID Kerry Hough, PA-C   20 mg at 04/08/17 8295  . hydrOXYzine (ATARAX/VISTARIL) tablet 25 mg  25 mg Oral Q6H PRN Cobos, Rockey Situ, MD   25 mg at 04/07/17 2140  . ibuprofen (ADVIL,MOTRIN) tablet 600 mg  600 mg Oral Q6H PRN Kerry Hough, PA-C   600 mg at 04/07/17 2020  . loperamide (IMODIUM) capsule 2-4 mg  2-4 mg Oral PRN Cobos, Rockey Situ, MD      . LORazepam (ATIVAN) tablet 1 mg  1 mg Oral Q6H PRN Cobos, Rockey Situ, MD      . Melene Muller ON 04/09/2017] LORazepam (ATIVAN) tablet 1 mg  1 mg Oral Daily Cobos, Fernando A, MD      . methocarbamol (ROBAXIN) tablet 500 mg  500 mg Oral Q8H PRN Cobos, Rockey Situ, MD   500 mg at 04/07/17 1819  . multivitamin with minerals tablet 1 tablet  1 tablet Oral Daily Cobos, Rockey Situ, MD   1 tablet at 04/08/17 0808  . nicotine (NICODERM CQ - dosed in mg/24 hr) patch 7 mg  7 mg Transdermal Daily Fransisca Kaufmann A, NP   7 mg at 04/07/17 0800  . ondansetron (ZOFRAN-ODT)  disintegrating tablet 4 mg  4 mg Oral Q6H PRN Cobos, Fernando A, MD      . thiamine (VITAMIN B-1) tablet 100 mg  100 mg Oral Daily Cobos, Rockey Situ, MD   100 mg at 04/08/17 0808  . traZODone (DESYREL) tablet 50 mg  50 mg Oral QHS PRN Cobos, Rockey Situ, MD        PTA Medications: Prescriptions Prior to Admission  Medication Sig Dispense Refill Last Dose  . diclofenac (VOLTAREN) 50 MG EC tablet Take 1 tablet (50 mg total) by mouth 2 (two) times daily. (Patient taking differently: Take 50 mg by mouth 2 (two) times daily as needed for mild pain. ) 15 tablet 0 Past Week at Unknown time  . methocarbamol (ROBAXIN) 500 MG tablet Take 1 tablet (500 mg total) by mouth 2 (two) times daily. (Patient taking differently: Take 500 mg by mouth 2 (two) times daily as needed for muscle spasms. ) 20 tablet 0 Past Week at Unknown time  . Multiple Vitamin (MULTIVITAMIN WITH MINERALS) TABS tablet Take 1 tablet by mouth daily.   Past Week at Unknown time    Treatment Modalities: Medication Management, Group therapy, Case management,  1 to  1 session with clinician, Psychoeducation, Recreational therapy.  Patient Stressors: Substance abuse Other: Mother has stage 4 cancer Patient Strengths: Average or above average intelligence Capable of independent living MetallurgistCommunication skills Financial means General fund of knowledge Motivation for treatment/growth Special hobby/interest Supportive family/friends Work Firefighterskills  Physician Treatment Plan for Primary Diagnosis: MDD (major depressive disorder), single episode Long Term Goal(s): Improvement in symptoms so as ready for discharge Short Term Goals: Ability to verbalize feelings will improve Ability to disclose and discuss suicidal ideas Ability to demonstrate self-control will improve Ability to identify and develop effective coping behaviors will improve Ability to maintain clinical measurements within normal limits will improve Ability to identify triggers  associated with substance abuse/mental health issues will improve  Medication Management: Evaluate patient's response, side effects, and tolerance of medication regimen.  Therapeutic Interventions: 1 to 1 sessions, Unit Group sessions and Medication administration.  Evaluation of Outcomes: Adequate for Discharge  Physician Treatment Plan for Secondary Diagnosis: Principal Problem:   MDD (major depressive disorder), single episode  Long Term Goal(s): Improvement in symptoms so as ready for discharge  Short Term Goals: Ability to verbalize feelings will improve Ability to disclose and discuss suicidal ideas Ability to demonstrate self-control will improve Ability to identify and develop effective coping behaviors will improve Ability to maintain clinical measurements within normal limits will improve Ability to identify triggers associated with substance abuse/mental health issues will improve  Medication Management: Evaluate patient's response, side effects, and tolerance of medication regimen.  Therapeutic Interventions: 1 to 1 sessions, Unit Group sessions and Medication administration.  Evaluation of Outcomes: Adequate for Discharge  RN Treatment Plan for Primary Diagnosis: MDD (major depressive disorder), single episode Long Term Goal(s): Knowledge of disease and therapeutic regimen to maintain health will improve  Short Term Goals: Ability to verbalize feelings will improve and Compliance with prescribed medications will improve  Medication Management: RN will administer medications as ordered by provider, will assess and evaluate patient's response and provide education to patient for prescribed medication. RN will report any adverse and/or side effects to prescribing provider.  Therapeutic Interventions: 1 on 1 counseling sessions, Psychoeducation, Medication administration, Evaluate responses to treatment, Monitor vital signs and CBGs as ordered, Perform/monitor CIWA, COWS,  AIMS and Fall Risk screenings as ordered, Perform wound care treatments as ordered.  Evaluation of Outcomes: Adequate for Discharge  LCSW Treatment Plan for Primary Diagnosis: MDD (major depressive disorder), single episode Long Term Goal(s): Safe transition to appropriate next level of care at discharge, Engage patient in therapeutic group addressing interpersonal concerns. Short Term Goals: Engage patient in aftercare planning with referrals and resources, Facilitate patient progression through stages of change regarding substance use diagnoses and concerns, Identify triggers associated with mental health/substance abuse issues and Increase skills for wellness and recovery  Therapeutic Interventions: Assess for all discharge needs, 1 to 1 time with Social worker, Explore available resources and support systems, Assess for adequacy in community support network, Educate family and significant other(s) on suicide prevention, Complete Psychosocial Assessment, Interpersonal group therapy.  Evaluation of Outcomes: Adequate for Discharge  Progress in Treatment: Attending groups: Yes Participating in groups: Yes Taking medication as prescribed: Yes, MD continues to assess for medication changes as needed Toleration medication: Yes, no side effects reported at this time Family/Significant other contact made: Yes, pt's wife contacted. Patient understands diagnosis: Yes, AEB pt's willingness to participate in treatment. Discussing patient identified problems/goals with staff: Yes Medical problems stabilized or resolved: Yes Denies suicidal/homicidal ideation: Yes Issues/concerns per patient self-inventory: None  Other: N/A  New problem(s) identified: None identified at this time.   New Short Term/Long Term Goal(s): None identified at this time.   Discharge Plan or Barriers: Pt will return home and follow up outpatient with [facility].  Reason for Continuation of Hospitalization:  None identified at  this time.  Estimated Length of Stay: 0 days  Attendees: Patient: 04/08/2017 10:25 AM  Physician: Dr. Jama Flavors 04/08/2017 10:25 AM  Nursing: Paulita Cradle, RN 04/08/2017 10:25 AM  RN Care Manager: Onnie Boer, RN 04/08/2017 10:25 AM  Social Worker: Donnelly Stager, LCSWA 04/08/2017 10:25 AM  Recreational Therapist:  04/08/2017 10:25 AM  Other: Armandina Stammer, NP; Gray Bernhardt, NP 04/08/2017 10:25 AM  Other:  04/08/2017 10:25 AM  Other: 04/08/2017 10:25 AM   Scribe for Treatment Team: Jonathon Jordan, MSW,LCSWA 04/08/2017 10:25 AM

## 2017-04-08 NOTE — Progress Notes (Signed)
  [facility] :  Will you be returning to the same living situation after discharge:  Yes,  pt returning home. At discharge, do you have transportation home?: Yes,  pt's car is in the parking lot. Do you have the ability to pay for your medications: Yes,  prescriptions and samples provided.  Release of information consent forms completed and in the chart;  Patient's signature needed at discharge.  Patient to Follow up at: Follow-up Information    Services, Alcohol And Drug Follow up.   Specialty:  Behavioral Health Why:  Please go for a walk-in appointment within 1-3 days of discharge to be established with outpatient services. Walk-in hours are Mon, Wed, and Fri 12PM-3PM. Please arrive as early as possible to be sure that you are seen. Contact information: [address] [place] [address] [phone]           Next level of care provider has [facility] to [facility] Link:no  Safety Planning and Suicide Prevention discussed: Yes,  with pt and with pt's wife.  Have you used any form of tobacco in the last 30 days? (Cigarettes, Smokeless Tobacco, Cigars, and/or Pipes): No  Has patient been referred to the Quitline?: N/A patient is not a smoker  Patient has been referred for addiction treatment: Yes  Jonathon JordanLynn B Truth Wolaver, MSW, LCSWA  04/08/2017, 10:27 AM
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
## 2017-04-08 NOTE — Progress Notes (Signed)
Recreation Therapy Notes  Date: 04/08/17 Time: 0930 Location: 300 [address] Dayroom  Group Topic: Stress Management  Goal Area(s) Addresses:  Patient will verbalize importance of using healthy stress management.  Patient will identify positive emotions associated with healthy stress management.   Intervention: Stress Management  Activity :  Guided Imagery.  LRT introduced the stress management technique of guided imagery.  LRT read a script to guide patients and engage them in the technique.  Patients were to follow along as LRT read script to fully participate in the technique.  Education:  Stress Management, Discharge Planning.   Education Outcome: Acknowledges edcuation/In group clarification offered/Needs additional education  Clinical Observations/Feedback: Pt did not attend group.   Caroll RancherMarjette Glory Graefe, LRT/CTRS         Caroll RancherLindsay, Julus Kelley A 04/08/2017 12:25 PM

## 2017-04-08 NOTE — Plan of Care (Signed)
Problem: Activity: Goal: Interest or engagement in activities will improve Outcome: Not Progressing Pt is irritable in bed this morning.

## 2017-04-08 NOTE — Progress Notes (Signed)
Pt d/c from the hospital. All items returned. D/C instructions given, prescriptions given and samples given. Pt denies si and hi. 

## 2017-04-08 NOTE — Discharge Summary (Signed)
Physician Discharge Summary Note  Patient:  Leonard [name] is an 36 y.o., male MRN:  [id number] DOB:  01/19/1981 Patient phone:  [phone] (home)  Patient address:   [address] [name] [address],  Total Time spent with patient: 30 minutes  Date of Admission:  04/04/2017 Date of Discharge: 04/08/2017  Reason for Admission:  Alcohol abuse  Principal Problem: MDD (major depressive disorder), single episode Discharge Diagnoses: Patient Active Problem List   Diagnosis Date Noted  . MDD (major depressive disorder), single episode [F32.9] 04/04/2017    Past Psychiatric History: see HPI  Past Medical History:  Past Medical History:  Diagnosis Date  . Alcohol abuse   . 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 No pertinent surgical history. Family History:  Family History  Problem Relation Age of Onset  . Cancer Other   . Hypertension Other    Family Psychiatric  History: see HPI Social History:  History  Alcohol Use  . 42.0 oz/week  . 70 Shots of liquor per week    Comment: 2 pints cognac weekly, last drink 3/17     History  Drug Use  . Types: Cocaine, Marijuana    Social History   Social History  . Marital status: Legally Separated    Spouse name: N/A  . Number of children: N/A  . Years of education: N/A   Social History Main Topics  . Smoking status: Current Some Day Smoker    Packs/day: 0.25    Types: Cigarettes  . Smokeless tobacco: Never Used  . Alcohol use 42.0 oz/week    70 Shots of liquor per week     Comment: 2 pints cognac weekly, last drink 3/17  . Drug use: Yes    Types: Cocaine, Marijuana  . Sexual activity: Yes   Other Topics Concern  . None   Social History Narrative  . None    Hospital Course:  EMMITT [name] is an 36 y.o. male presented to [facility] voluntarily as a walk-in. Pt reportedly has been consuming excessive amount of alcohol and abusing drugs. Pt appeared disheveled during the assessment often  talking loudly and laughing inappropriately throughout the assessment.  Leonard [name] was admitted for MDD (major depressive disorder), single episode and crisis management.  Patient was treated with medications with their indications listed below in detail under Medication List.  Medical problems were identified and treated as needed.  Home medications were restarted as appropriate.  Improvement was monitored by observation and Leonard [name] daily report of symptom reduction.  Emotional and mental status was monitored by daily self inventory reports completed by Leonard [name] and clinical staff.  Patient reported continued improvement, denied any new concerns.  Patient had been compliant on medications and denied side effects.  Support and encouragement was provided.         Leonard [name] was evaluated by the treatment team for stability and plans for continued recovery upon discharge.  Patient was offered further treatment options upon discharge including Residential, Intensive Outpatient and Outpatient treatment. Patient will follow up with agency listed below for medication management and counseling.  Encouraged patient to maintain satisfactory support network and home environment.  Advised to adhere to medication compliance and outpatient treatment follow up.  Prescriptions provided.       Leonard [name] motivation was an integral factor for scheduling further treatment.  Employment, transportation, bed availability, health status, family support, and any pending legal issues  were also considered during patient's hospital stay.  Upon completion of this admission the patient was both mentally and medically stable for discharge denying suicidal/homicidal ideation, auditory/visual/tactile hallucinations, delusional thoughts and paranoia.      Physical Findings: AIMS: Facial and Oral Movements Muscles of Facial Expression: None, normal Lips and Perioral Area: None,  normal Jaw: None, normal Tongue: None, normal,Extremity Movements Upper (arms, wrists, hands, fingers): None, normal Lower (legs, knees, ankles, toes): None, normal, Trunk Movements Neck, shoulders, hips: None, normal, Overall Severity Severity of abnormal movements (highest score from questions above): None, normal Incapacitation due to abnormal movements: None, normal Patient's awareness of abnormal movements (rate only patient's report): No Awareness, Dental Status Current problems with teeth and/or dentures?: No Does patient usually wear dentures?: No  CIWA:  CIWA-Ar Total: 0 COWS:     Musculoskeletal: Strength & Muscle Tone: within normal limits Gait & Station: normal Patient leans: N/A  Psychiatric Specialty Exam:  See MD SRA Physical Exam  Nursing note and vitals reviewed.   ROS  Blood pressure 116/75, pulse (!) 52, temperature 97.4 F (36.3 C), temperature source Oral, resp. rate 16, height 6' 1.5" (1.867 m), weight 86.9 kg (191 lb 8 oz), SpO2 100 %.Body mass index is 24.92 kg/m.    Have you used any form of tobacco in the last 30 days? (Cigarettes, Smokeless Tobacco, Cigars, and/or Pipes): No  Has this patient used any form of tobacco in the last 30 days? (Cigarettes, Smokeless Tobacco, Cigars, and/or Pipes) Yes, N/A  Blood Alcohol level:  Lab Results  Component Value Date   ETH 148 (H) 04/04/2017   ETH 9 11/14/2014    Metabolic Disorder Labs:  No results found for: HGBA1C, MPG No results found for: PROLACTIN No results found for: CHOL, TRIG, HDL, CHOLHDL, VLDL, LDLCALC  See Psychiatric Specialty Exam and Suicide Risk Assessment completed by Attending Physician prior to discharge.  Discharge destination:  Home  Is patient on multiple antipsychotic therapies at discharge:  No   Has Patient had three or more failed trials of antipsychotic monotherapy by history:  No  Recommended Plan for Multiple Antipsychotic Therapies: NA   Allergies as of 04/08/2017    No Known Allergies     Medication List    STOP taking these medications   methocarbamol 500 MG tablet Commonly known as:  ROBAXIN   multivitamin with minerals Tabs tablet     TAKE these medications     Indication  acamprosate 333 MG tablet Commonly known as:  CAMPRAL Take 2 tablets (666 mg total) by mouth 3 (three) times daily with meals.  Indication:  Excessive Use of Alcohol   diclofenac 50 MG EC tablet Commonly known as:  VOLTAREN Take 1 tablet (50 mg total) by mouth 2 (two) times daily. What changed:  when to take this  reasons to take this  Indication:  Joint Damage causing Pain and Loss of Function   DULoxetine 20 MG capsule Commonly known as:  CYMBALTA Take 1 capsule (20 mg total) by mouth 2 (two) times daily.  Indication:  Major Depressive Disorder   hydrOXYzine 25 MG tablet Commonly known as:  ATARAX/VISTARIL Take 1 tablet (25 mg total) by mouth every 6 (six) hours as needed for anxiety.  Indication:  Anxiety Neurosis   nicotine 7 mg/24hr patch Commonly known as:  NICODERM CQ - dosed in mg/24 hr Place 1 patch (7 mg total) onto the skin daily. Start taking on:  04/09/2017  Indication:  Alzheimer's Disease   traZODone 50  MG tablet Commonly known as:  DESYREL Take 1 tablet (50 mg total) by mouth at bedtime as needed for sleep.  Indication:  Trouble Sleeping      Follow-up Information    Services, Alcohol And Drug Follow up.   Specialty:  Behavioral Health Why:  Please go for a walk-in appointment within 1-3 days of discharge to be established with outpatient services. Walk-in hours are Mon, Wed, and Fri 12PM-3PM. Please arrive as early as possible to be sure that you are seen. Contact information: [address] [place] [address] [phone]           Follow-up recommendations:  Activity:  as tol Diet:  as tol  Comments:  1.  Take all your medications as prescribed.   2.  Report any adverse side effects to outpatient  provider. 3.  Patient instructed to not use alcohol or illegal drugs while on prescription medicines. 4.  In the event of worsening symptoms, instructed patient to call 911, the crisis hotline or go to nearest emergency room for evaluation of symptoms.  Signed: Lindwood QuaSheila May Elbert Spickler, NP [facility] 04/08/2017, 6:29 PM

## 2017-04-08 NOTE — [facility] Suicide Risk Assessment (Signed)
[facility]   Principal Problem: MDD (major depressive disorder), single episode Discharge Diagnoses:  Patient Active Problem List   Diagnosis Date Noted  . MDD (major depressive disorder), single episode [F32.9] 04/04/2017    Total Time spent with patient: 30 minutes  Musculoskeletal: Strength & Muscle Tone: within normal limits Gait & Station: normal Patient leans: N/A  Psychiatric Specialty Exam: ROS no headache, no chest pain, no shortness of breath, no nausea, no vomiting   Blood pressure (!) 0/0, pulse 60, temperature 97.4 F (36.3 C), temperature source Oral, resp. rate 18, height 6' 1.5" (1.867 m), weight 86.9 kg (191 lb 8 oz), SpO2 100 %.Body mass index is 24.92 kg/m.  General Appearance: Well Groomed  Patent attorneyye Contact::  Good  Speech:  Normal Rate409  Volume:  Normal  Mood:  improving mood   Affect:  Appropriate and reactive   Thought Process:  Linear and Descriptions of Associations: Intact  Orientation:  Full (Time, Place, and Person)  Thought Content:  no hallucinations , no delusions, not internally preoccupied   Suicidal Thoughts:  No denies suicidal or self injurious ideations, denies any violent or homicidal ideations   Homicidal Thoughts:  No  Memory:  recent and remote grossly intact   Judgement:  Other:  improved   Insight:  improved   Psychomotor Activity:  Normal  Concentration:  Good  Recall:  Good  Fund of Knowledge:Good  Language: Good  Akathisia:  Negative  Handed:  Right  AIMS (if indicated):     Assets:  Desire for Improvement Resilience  Sleep:  Number of Hours: 5.25  Cognition: WNL  ADL's:  Intact   Mental Status Per Nursing Assessment::   On Admission:  NA  Demographic Factors:  36 year old married male, three children, employed   Loss Factors: Mother medically ill with cancer   Historical Factors: No prior psychiatric admissions, no history of suicide attempts, no prior history of severe depression,  history of alcohol abuse   Risk Reduction Factors:   Responsible for children under 35 years of age, Sense of responsibility to family, Living with another person, especially a relative and Positive coping skills or problem solving skills  Continued Clinical Symptoms:  At this time patient is improved , presents calm, pleasant, mood is described as improved, affect is appropriate, no thought disorder, no suicidal or self injurious ideations, no psychotic symptoms, future oriented. No current alcohol WDL symptoms, no tremors, no diaphoresis, no restlessness. Behavior on unit in good control  Denies medication side effects  Cognitive Features That Contribute To Risk:  No gross cognitive deficits noted upon discharge. Is alert , attentive, and oriented x 3   Suicide Risk:  Mild:  Suicidal ideation of limited frequency, intensity, duration, and specificity.  There are no identifiable plans, no associated intent, mild dysphoria and related symptoms, good self-control (both objective and subjective assessment), few other risk factors, and identifiable protective factors, including available and accessible social support.  Follow-up Information    Services, Alcohol And Drug Follow up.   Specialty:  Behavioral Health Why:  Please go for a walk-in appointment within 1-3 days of discharge to be established with outpatient services. Walk-in hours are Mon, Wed, and Fri 12PM-3PM. Please arrive as early as possible to be sure that you are seen. Contact information: [address] [place] [address] [phone]           Plan Of Care/Follow-up recommendations:  Activity:  as tolerated Diet:  Regular Tests:  NA Other:  See below  Patient is requesting discharge and there are no current grounds for involuntary commitment  Leaving unit in good spirits Plans to follow up as above, AA participation encouraged .  Craige Cotta, MD 04/08/2017, 1:21 PM

## 2017-06-12 ENCOUNTER — Ambulatory Visit (HOSPITAL_COMMUNITY)
Admission: RE | Admit: 2017-06-12 | Discharge: 2017-06-12 | Disposition: A | Discharge status: Home / Self Care | Payer: Medicaid Other | Attending: Psychiatry | Admitting: Psychiatry

## 2017-06-12 ENCOUNTER — Encounter (HOSPITAL_COMMUNITY): Payer: Self-pay | Admitting: Emergency Medicine

## 2017-06-12 DIAGNOSIS — F1210 Cannabis abuse, uncomplicated: Secondary | ICD-10-CM | POA: Insufficient documentation

## 2017-06-12 DIAGNOSIS — F1994 Other psychoactive substance use, unspecified with psychoactive substance-induced mood disorder: Secondary | ICD-10-CM | POA: Insufficient documentation

## 2017-06-12 DIAGNOSIS — F1010 Alcohol abuse, uncomplicated: Secondary | ICD-10-CM | POA: Insufficient documentation

## 2017-06-12 DIAGNOSIS — F17210 Nicotine dependence, cigarettes, uncomplicated: Secondary | ICD-10-CM | POA: Insufficient documentation

## 2017-06-12 DIAGNOSIS — F1410 Cocaine abuse, uncomplicated: Secondary | ICD-10-CM | POA: Insufficient documentation

## 2017-06-12 LAB — CBC
HCT: 42.8 % (ref 39.0–52.0)
HEMOGLOBIN: 15.1 g/dL (ref 13.0–17.0)
MCH: 33 pg (ref 26.0–34.0)
MCHC: 35.3 g/dL (ref 30.0–36.0)
MCV: 93.4 fL (ref 78.0–100.0)
PLATELETS: 215 10*3/uL (ref 150–400)
RBC: 4.58 MIL/uL (ref 4.22–5.81)
RDW: 12.6 % (ref 11.5–15.5)
WBC: 6.2 10*3/uL (ref 4.0–10.5)

## 2017-06-12 NOTE — H&P (Signed)
Behavioral Health Medical Screening Exam  Leonard AngerRobert D Fitzmaurice is an 36 y.o. male.  Total Time spent with patient: 15 minutes  Psychiatric Specialty Exam: Physical Exam  Constitutional: He is oriented to person, place, and time. He appears well-developed and well-nourished. No distress.  HENT:  Head: Normocephalic and atraumatic.  Right Ear: External ear normal.  Left Ear: External ear normal.  Eyes: Pupils are equal, round, and reactive to light. Conjunctivae are normal. Right eye exhibits no discharge. Left eye exhibits no discharge. No scleral icterus.  Neck: Normal range of motion.  Cardiovascular: Normal rate, regular rhythm and normal heart sounds.   Respiratory: Effort normal and breath sounds normal. No respiratory distress.  Musculoskeletal: Normal range of motion.  Neurological: He is alert and oriented to person, place, and time.  Skin: Skin is warm and dry. He is not diaphoretic.  Psychiatric: His mood appears anxious. His speech is rapid and/or pressured. He is hyperactive. He is not actively hallucinating. Thought content is paranoid. Thought content is not delusional. Cognition and memory are normal. He expresses impulsivity and inappropriate judgment. He exhibits a depressed mood. He expresses no homicidal and no suicidal ideation.    Review of Systems  Constitutional: Negative for chills and fever.  HENT: Negative for ear pain and sore throat.        Tooth pain  Respiratory: Negative for shortness of breath.   Cardiovascular: Negative for chest pain.  Gastrointestinal: Negative for diarrhea, nausea and vomiting.  Musculoskeletal: Positive for joint pain.  Neurological: Negative for dizziness and weakness.  Psychiatric/Behavioral: Positive for depression and substance abuse. Negative for hallucinations, memory loss and suicidal ideas. The patient is nervous/anxious and has insomnia.   All other systems reviewed and are negative.   Blood pressure (!) 139/92, pulse 65,  temperature 98.6 F (37 C), temperature source Oral, resp. rate 18, SpO2 100 %.There is no height or weight on file to calculate BMI.  General Appearance: Disheveled  Eye Contact:  Fair  Speech:  Pressured  Volume:  Normal  Mood:  Angry, Anxious, Depressed, Hopeless, Irritable and Worthless  Affect:  Congruent and Depressed  Thought Process:  Coherent  Orientation:  Full (Time, Place, and Person)  Thought Content:  Logical, Hallucinations: None and Paranoid Ideation  Suicidal Thoughts:  No  Homicidal Thoughts:  No  Memory:  Immediate;   Good Recent;   Good Remote;   Good  Judgement:  Intact  Insight:  Fair  Psychomotor Activity:  Increased, Mannerisms and Restlessness  Concentration: Concentration: Poor and Attention Span: Poor  Recall:  Good  Fund of Knowledge:Good  Language: Good  Akathisia:  NA  Handed:  Right  AIMS (if indicated):     Assets:  Communication Skills Desire for Improvement Housing Intimacy Leisure Time Physical Health  Sleep:       Musculoskeletal: Strength & Muscle Tone: within normal limits Gait & Station: unsteady   Blood pressure (!) 139/92, pulse 65, temperature 98.6 F (37 C), temperature source Oral, resp. rate 18, SpO2 100 %.  Recommendations:  Based on my evaluation the patient does not appear to have an emergency medical condition. Patient appears to be intoxicated, will send for medical clearance and reevaluation. States last drink was at 7 pm, denies use of other substances.  Jackelyn PolingJason A Tylene Quashie, NP 06/12/2017
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
## 2017-06-12 NOTE — ED Triage Notes (Signed)
Patient is wanting to go to a facility for detox of alcohol. Patient states his last drink was at 7 pm tonight. Patient denies HI or SI. Patient states he is intoxicated.

## 2017-06-13 ENCOUNTER — Emergency Department (HOSPITAL_COMMUNITY)
Admission: EM | Admit: 2017-06-13 | Discharge: 2017-06-13 | Disposition: A | Discharge status: Home / Self Care | Payer: Medicaid Other | Attending: Emergency Medicine | Admitting: Emergency Medicine

## 2017-06-13 DIAGNOSIS — F17210 Nicotine dependence, cigarettes, uncomplicated: Secondary | ICD-10-CM | POA: Diagnosis not present

## 2017-06-13 DIAGNOSIS — F1490 Cocaine use, unspecified, uncomplicated: Secondary | ICD-10-CM

## 2017-06-13 DIAGNOSIS — F1010 Alcohol abuse, uncomplicated: Secondary | ICD-10-CM

## 2017-06-13 DIAGNOSIS — F1994 Other psychoactive substance use, unspecified with psychoactive substance-induced mood disorder: Secondary | ICD-10-CM | POA: Diagnosis present

## 2017-06-13 DIAGNOSIS — F1210 Cannabis abuse, uncomplicated: Secondary | ICD-10-CM | POA: Diagnosis not present

## 2017-06-13 LAB — SALICYLATE LEVEL

## 2017-06-13 LAB — COMPREHENSIVE METABOLIC PANEL
ALK PHOS: 56 U/L (ref 38–126)
ALT: 26 U/L (ref 17–63)
AST: 26 U/L (ref 15–41)
Albumin: 4.2 g/dL (ref 3.5–5.0)
Anion gap: 9 (ref 5–15)
BUN: 15 mg/dL (ref 6–20)
CALCIUM: 9.1 mg/dL (ref 8.9–10.3)
CO2: 25 mmol/L (ref 22–32)
CREATININE: 1.08 mg/dL (ref 0.61–1.24)
Chloride: 104 mmol/L (ref 101–111)
Glucose, Bld: 90 mg/dL (ref 65–99)
Potassium: 3.9 mmol/L (ref 3.5–5.1)
Sodium: 138 mmol/L (ref 135–145)
Total Bilirubin: 0.6 mg/dL (ref 0.3–1.2)
Total Protein: 7.4 g/dL (ref 6.5–8.1)

## 2017-06-13 LAB — RAPID URINE DRUG SCREEN, HOSP PERFORMED
Amphetamines: NOT DETECTED
BARBITURATES: NOT DETECTED
BENZODIAZEPINES: NOT DETECTED
COCAINE: NOT DETECTED
Opiates: NOT DETECTED
TETRAHYDROCANNABINOL: POSITIVE — AB

## 2017-06-13 LAB — ETHANOL

## 2017-06-13 LAB — ACETAMINOPHEN LEVEL: Acetaminophen (Tylenol), Serum: <10 ug/mL — ABNORMAL LOW (ref 10–30)

## 2017-06-13 MED ORDER — ACETAMINOPHEN 325 MG PO TABS: 650.0000 mg | ORAL_TABLET | Freq: Four times a day (QID) | ORAL | Status: DC | PRN

## 2017-06-13 MED ORDER — GABAPENTIN 300 MG PO CAPS
300.0000 mg | ORAL_CAPSULE | Freq: Three times a day (TID) | ORAL | Status: DC
  Administered 2017-06-13: 300 mg via ORAL
  Filled 2017-06-13: qty 1

## 2017-06-13 MED ORDER — LORAZEPAM 2 MG/ML IJ SOLN: 0.0000 mg | Freq: Two times a day (BID) | INTRAMUSCULAR | Status: DC

## 2017-06-13 MED ORDER — THIAMINE HCL 100 MG/ML IJ SOLN: 100.0000 mg | Freq: Every day | INTRAMUSCULAR | Status: DC

## 2017-06-13 MED ORDER — LORAZEPAM 1 MG PO TABS: 0.0000 mg | ORAL_TABLET | Freq: Four times a day (QID) | ORAL | Status: DC

## 2017-06-13 MED ORDER — LORAZEPAM 1 MG PO TABS: 0.0000 mg | ORAL_TABLET | Freq: Two times a day (BID) | ORAL | Status: DC

## 2017-06-13 MED ORDER — LORAZEPAM 2 MG/ML IJ SOLN: 0.0000 mg | Freq: Four times a day (QID) | INTRAMUSCULAR | Status: DC

## 2017-06-13 MED ORDER — GABAPENTIN 300 MG PO CAPS: 300.0000 mg | ORAL_CAPSULE | Freq: Three times a day (TID) | ORAL | 0 refills | Status: DC

## 2017-06-13 MED ORDER — ONDANSETRON HCL 4 MG PO TABS: 4.0000 mg | ORAL_TABLET | Freq: Three times a day (TID) | ORAL | Status: DC | PRN

## 2017-06-13 MED ORDER — VITAMIN B-1 100 MG PO TABS: 100.0000 mg | ORAL_TABLET | Freq: Every day | ORAL | Status: DC

## 2017-06-13 NOTE — ED Notes (Signed)
Pt d/c home per MD order. Discharge summary reviewed with pt. RX provided. Pt verbalizes understanding. Pt denies SI/HI/AVH. Personal property returned to pt. Pt ambulatory off unit, security present.

## 2017-06-13 NOTE — ED Notes (Signed)
[facility] Expected date:  Expected time:  Means of arrival:  Comments: Leonard FrederickMcLaughlin

## 2017-06-13 NOTE — ED Notes (Signed)
Bed: [facility] Expected date:  Expected time:  Means of arrival:  Comments: 

## 2017-06-13 NOTE — Consult Note (Signed)
[facility] Consult   Reason for Consult:  Substance abuse, requesting [facility] Referring Physician:  EDP Patient Identification: Leonard [name] MRN:  [id number] Principal Diagnosis: Substance induced mood disorder ([place]) Diagnosis:   Patient Active Problem List   Diagnosis Date Noted  . Cannabis abuse [F12.10] 06/13/2017    Priority: High  . Substance induced mood disorder [facility]) [F19.94] 06/13/2017    Priority: High    Total Time spent with patient: 45 minutes  Subjective:   Leonard [name] is a 35 y.o. male patient does not warrant admission.  HPI:  36 yo male who male who came to the ED requesting detox from alcohol and cocaine, negative for both.  He was here In May prior to his court date. Leonard [name] did not follow up but wants 'long-term treatment' for his substance abuse.  No suicidal/homicidal ideations, hallucinations, or withdrawal symptoms.  Does not meet inpatient criteria, encouraged him to continue his pursuit at getting into [facility].  STable for discharge.  Past Psychiatric History: substance abuse  Risk to Self: Is patient at risk for suicide?: No, but patient needs Medical Clearance Risk to Others:  None Prior Inpatient Therapy:  Multiple times Prior Outpatient Therapy:  Yes  Past Medical History:  Past Medical History:  Diagnosis Date  . Alcohol abuse   . Seizures ([place])    while detoxing   History reviewed. No pertinent surgical history. Family History:  Family History  Problem Relation Age of Onset  . Cancer Other   . Hypertension Other    Family Psychiatric  History: unknown Social History:  History  Alcohol Use  . 42.0 oz/week  . 70 Shots of liquor per week    Comment: 2 pints cognac weekly, last drink 3/17     History  Drug Use  . Types: Cocaine, Marijuana    Social History   Social History  . Marital status: Legally Separated    Spouse name: N/A  . Number of children: N/A  . Years of education: N/A    Social History Main Topics  . Smoking status: Current Some Day Smoker    Packs/day: 0.25    Types: Cigarettes  . Smokeless tobacco: Never Used  . Alcohol use 42.0 oz/week    70 Shots of liquor per week     Comment: 2 pints cognac weekly, last drink 3/17  . Drug use: Yes    Types: Cocaine, Marijuana  . Sexual activity: Yes   Other Topics Concern  . None   Social History Narrative  . None   Additional Social History:    Allergies:  No Known Allergies  Labs:  Results for orders placed or performed during the hospital encounter of 06/13/17 (from the past 48 hour(s))  Comprehensive metabolic panel     Status: None   Collection Time: 06/12/17 11:38 PM  Result Value Ref Range   Sodium 138 135 - 145 mmol/L   Potassium 3.9 3.5 - 5.1 mmol/L   Chloride 104 101 - 111 mmol/L   CO2 25 22 - 32 mmol/L   Glucose, Bld 90 65 - 99 mg/dL   BUN 15 6 - 20 mg/dL   Creatinine, Ser 1.08 0.61 - 1.24 mg/dL   Calcium 9.1 8.9 - 10.3 mg/dL   Total Protein 7.4 6.5 - 8.1 g/dL   Albumin 4.2 3.5 - 5.0 g/dL   AST 26 15 - 41 U/L   ALT 26 17 - 63 U/L   Alkaline Phosphatase 56 38 - 126  U/L   Total Bilirubin 0.6 0.3 - 1.2 mg/dL   GFR calc non Af Amer >60 >60 mL/min   GFR calc Af Amer >60 >60 mL/min    Comment: (NOTE) The eGFR has been calculated using the CKD EPI equation. This calculation has not been validated in all clinical situations. eGFR's persistently <60 mL/min signify possible Chronic Kidney Disease.    Anion gap 9 5 - 15  Ethanol     Status: None   Collection Time: 06/12/17 11:38 PM  Result Value Ref Range   Alcohol, Ethyl (B) <5 <5 mg/dL    Comment:        LOWEST DETECTABLE LIMIT FOR SERUM ALCOHOL IS 5 mg/dL FOR MEDICAL PURPOSES ONLY   Salicylate level     Status: None   Collection Time: 06/12/17 11:38 PM  Result Value Ref Range   Salicylate Lvl <3.8 2.8 - 30.0 mg/dL  Acetaminophen level     Status: Abnormal   Collection Time: 06/12/17 11:38 PM  Result Value Ref Range    Acetaminophen (Tylenol), Serum <10 (L) 10 - 30 ug/mL    Comment:        THERAPEUTIC CONCENTRATIONS VARY SIGNIFICANTLY. A RANGE OF 10-30 ug/mL MAY BE AN EFFECTIVE CONCENTRATION FOR MANY PATIENTS. HOWEVER, SOME ARE BEST TREATED AT CONCENTRATIONS OUTSIDE THIS RANGE. ACETAMINOPHEN CONCENTRATIONS >150 ug/mL AT 4 HOURS AFTER INGESTION AND >50 ug/mL AT 12 HOURS AFTER INGESTION ARE OFTEN ASSOCIATED WITH TOXIC REACTIONS.   cbc     Status: None   Collection Time: 06/12/17 11:38 PM  Result Value Ref Range   WBC 6.2 4.0 - 10.5 K/uL   RBC 4.58 4.22 - 5.81 MIL/uL   Hemoglobin 15.1 13.0 - 17.0 g/dL   HCT 42.8 39.0 - 52.0 %   MCV 93.4 78.0 - 100.0 fL   MCH 33.0 26.0 - 34.0 pg   MCHC 35.3 30.0 - 36.0 g/dL   RDW 12.6 11.5 - 15.5 %   Platelets 215 150 - 400 K/uL  Rapid urine drug screen (hospital performed)     Status: Abnormal   Collection Time: 06/12/17 11:53 PM  Result Value Ref Range   Opiates NONE DETECTED NONE DETECTED   Cocaine NONE DETECTED NONE DETECTED   Benzodiazepines NONE DETECTED NONE DETECTED   Amphetamines NONE DETECTED NONE DETECTED   Tetrahydrocannabinol POSITIVE (A) NONE DETECTED   Barbiturates NONE DETECTED NONE DETECTED    Comment:        DRUG SCREEN FOR MEDICAL PURPOSES ONLY.  IF CONFIRMATION IS NEEDED FOR ANY PURPOSE, NOTIFY LAB WITHIN 5 DAYS.        LOWEST DETECTABLE LIMITS FOR URINE DRUG SCREEN Drug Class       Cutoff (ng/mL) Amphetamine      1000 Barbiturate      200 Benzodiazepine   937 Tricyclics       342 Opiates          300 Cocaine          300 THC              50     Current Facility-Administered Medications  Medication Dose Route Frequency Provider Last Rate Last Dose  . acetaminophen (TYLENOL) tablet 650 mg  650 mg Oral Q6H PRN Waynetta Pean, PA-C      . ondansetron [facility]) tablet 4 mg  4 mg Oral Q8H PRN Waynetta Pean, PA-C       Current Outpatient Prescriptions  Medication Sig Dispense Refill  . diclofenac (VOLTAREN) 50 MG EC  tablet  Take 1 tablet (50 mg total) by mouth 2 (two) times daily. (Patient taking differently: Take 50 mg by mouth 2 (two) times daily as needed for mild pain or moderate pain. ) 30 tablet 0  . acamprosate (CAMPRAL) 333 MG tablet Take 2 tablets (666 mg total) by mouth 3 (three) times daily with meals. (Patient not taking: Reported on 06/13/2017) 180 tablet 0  . DULoxetine (CYMBALTA) 20 MG capsule Take 1 capsule (20 mg total) by mouth 2 (two) times daily. (Patient not taking: Reported on 06/13/2017) 30 capsule 0  . hydrOXYzine (ATARAX/VISTARIL) 25 MG tablet Take 1 tablet (25 mg total) by mouth every 6 (six) hours as needed for anxiety. (Patient not taking: Reported on 06/13/2017) 30 tablet 0  . nicotine (NICODERM CQ - DOSED IN MG/24 HR) 7 mg/24hr patch Place 1 patch (7 mg total) onto the skin daily. (Patient not taking: Reported on 06/13/2017) 28 patch 0  . traZODone (DESYREL) 50 MG tablet Take 1 tablet (50 mg total) by mouth at bedtime as needed for sleep. (Patient not taking: Reported on 06/13/2017) 30 tablet 0    Musculoskeletal: Strength & Muscle Tone: within normal limits Gait & Station: normal Patient leans: N/A  Psychiatric Specialty Exam: Physical Exam  Constitutional: He is oriented to person, place, and time. He appears well-developed and well-nourished.  HENT:  Head: Normocephalic.  Neck: Normal range of motion.  Respiratory: Effort normal.  Musculoskeletal: Normal range of motion.  Neurological: He is alert and oriented to person, place, and time.  Psychiatric: He has a normal mood and affect. His speech is normal and behavior is normal. Judgment and thought content normal. Cognition and memory are normal.    Review of Systems  Psychiatric/Behavioral: Positive for substance abuse.  All other systems reviewed and are negative.   Blood pressure (!) 142/92, pulse 85, temperature 97.6 F (36.4 C), temperature source Oral, resp. rate 16, SpO2 100 %.There is no height or weight on file to  calculate BMI.  General Appearance: Casual  Eye Contact:  Good  Speech:  Normal Rate  Volume:  Normal  Mood:  Irritable  Affect:  Congruent  Thought Process:  Coherent and Descriptions of Associations: Intact  Orientation:  Full (Time, Place, and Person)  Thought Content:  WDL and Logical  Suicidal Thoughts:  No  Homicidal Thoughts:  No  Memory:  Immediate;   Good Recent;   Good Remote;   Good  Judgement:  Fair  Insight:  Fair  Psychomotor Activity:  Normal  Concentration:  Concentration: Good and Attention Span: Good  Recall:  Good  Fund of Knowledge:  Good  Language:  Good  Akathisia:  No  Handed:  Right  AIMS (if indicated):     Assets:  Leisure Time Physical Health Resilience Social Support  ADL's:  Intact  Cognition:  WNL  Sleep:        Treatment Plan Summary: Daily contact with patient to assess and evaluate symptoms and progress in treatment, Medication management and Plan substance induced mood disorder:  -Crisis stabilization -Medication management:  Started Gabapentin 300 mg TID for withdrawal symptom prevention -Individual and substance abuse counseling -[facility] resources  Disposition: No evidence of imminent risk to self or others at present.    Waylan Boga, NP 06/13/2017 10:28 AM  Patient seen face-to-face for psychiatric evaluation, chart reviewed and case discussed with the physician extender and developed treatment plan. Reviewed the information documented and agree with the treatment plan. Corena Pilgrim, MD

## 2017-06-13 NOTE — ED Provider Notes (Signed)
[facility]-EMERGENCY DEPT Provider Note   CSN: [id number] Arrival date & time: 06/12/17  2234     History   Chief Complaint Chief Complaint  Patient presents with  . Alcohol Intoxication    HPI Leonard [name] is a 36 y.o. male.  Leonard [name] is a 36 y.o. Male with history of alcohol abuse who presents to the emergency department from behavioral health for medical clearance and requesting alcohol detox. Patient tells me he went to behavioral health tonight requesting detox from alcohol. He was sent to the emergency department for medical clearance. He is to be evaluated by psychiatry in the morning. Patient tells me he last went through alcohol detox 2 months ago. He was sober for 11 days. He's been drinking daily since. His last check was around 7 PM this evening or about 8 hours prior to my evaluation. He reports a history of seizures with alcohol withdrawal back in 2012. He denies physical complaints. He denies suicidal homicidal ideations.   The history is provided by the patient and medical records. No language interpreter was used.  Alcohol Intoxication  Pertinent negatives include no chest pain, no abdominal pain, no headaches and no shortness of breath.    Past Medical History:  Diagnosis Date  . Alcohol abuse   . Seizures ([facility])    while detoxing    Patient Active Problem List   Diagnosis Date Noted  . MDD (major depressive disorder), single episode 04/04/2017    History reviewed. No pertinent surgical history.     Home Medications    Prior to Admission medications   Medication Sig Start Date End Date Taking? Authorizing Provider  diclofenac (VOLTAREN) 50 MG EC tablet Take 1 tablet (50 mg total) by mouth 2 (two) times daily. Patient taking differently: Take 50 mg by mouth 2 (two) times daily as needed for mild pain or moderate pain.  04/08/17  Yes Adonis Brook, NP  acamprosate (CAMPRAL) 333 MG tablet Take 2 tablets (666 mg total) by mouth 3 (three)  times daily with meals. Patient not taking: Reported on 06/13/2017 04/08/17   Adonis Brook, NP  DULoxetine (CYMBALTA) 20 MG capsule Take 1 capsule (20 mg total) by mouth 2 (two) times daily. Patient not taking: Reported on 06/13/2017 04/08/17   Adonis Brook, NP  hydrOXYzine (ATARAX/VISTARIL) 25 MG tablet Take 1 tablet (25 mg total) by mouth every 6 (six) hours as needed for anxiety. Patient not taking: Reported on 06/13/2017 04/08/17   Adonis Brook, NP  nicotine (NICODERM CQ - DOSED IN MG/24 HR) 7 mg/24hr patch Place 1 patch (7 mg total) onto the skin daily. Patient not taking: Reported on 06/13/2017 04/09/17   Adonis Brook, NP  traZODone (DESYREL) 50 MG tablet Take 1 tablet (50 mg total) by mouth at bedtime as needed for sleep. Patient not taking: Reported on 06/13/2017 04/08/17   Adonis Brook, NP    Family History Family History  Problem Relation Age of Onset  . Cancer Other   . Hypertension Other     Social History Social History  Substance Use Topics  . Smoking status: Current Some Day Smoker    Packs/day: 0.25    Types: Cigarettes  . Smokeless tobacco: Never Used  . Alcohol use 42.0 oz/week    70 Shots of liquor per week     Comment: 2 pints cognac weekly, last drink 3/17     Allergies   Patient has no known allergies.   Review of Systems Review of Systems  Constitutional:  Negative for chills and fever.  HENT: Negative for congestion and sore throat.   Eyes: Negative for visual disturbance.  Respiratory: Negative for cough and shortness of breath.   Cardiovascular: Negative for chest pain.  Gastrointestinal: Negative for abdominal pain, nausea and vomiting.  Genitourinary: Negative for dysuria.  Musculoskeletal: Negative for back pain and neck pain.  Skin: Negative for rash.  Neurological: Negative for seizures, weakness, light-headedness and headaches.     Physical Exam Updated Vital Signs BP (!) 142/92 (BP Location: Right Arm)   Pulse 85   Temp 97.6  F (36.4 C) (Oral)   Resp 16   SpO2 100%   Physical Exam  Constitutional: He is oriented to person, place, and time. He appears well-developed and well-nourished. No distress.  Nontoxic appearing.  HENT:  Head: Normocephalic and atraumatic.  No tongue fasciculations.  Eyes: Pupils are equal, round, and reactive to light. Conjunctivae are normal. Right eye exhibits no discharge. Left eye exhibits no discharge.  Neck: Neck supple.  Cardiovascular: Normal rate, regular rhythm, normal heart sounds and intact distal pulses.   Pulmonary/Chest: Effort normal and breath sounds normal. No respiratory distress.  Abdominal: Soft. There is no tenderness.  Musculoskeletal: He exhibits no edema or tenderness.  Lymphadenopathy:    He has no cervical adenopathy.  Neurological: He is alert and oriented to person, place, and time. Coordination normal.  Skin: Skin is warm and dry. No rash noted. He is not diaphoretic. No erythema. No pallor.  Psychiatric: He has a normal mood and affect. His behavior is normal. His speech is not rapid and/or pressured. He is not actively hallucinating. He expresses no homicidal and no suicidal ideation.  He denies suicidal or homicidal ideations.  Nursing note and vitals reviewed.    ED Treatments / Results  Labs (all labs ordered are listed, but only abnormal results are displayed) Labs Reviewed  ACETAMINOPHEN LEVEL - Abnormal; Notable for the following:       Result Value   Acetaminophen (Tylenol), Serum <10 (*)    All other components within normal limits  RAPID URINE DRUG SCREEN, HOSP PERFORMED - Abnormal; Notable for the following:    Tetrahydrocannabinol POSITIVE (*)    All other components within normal limits  COMPREHENSIVE METABOLIC PANEL  ETHANOL  SALICYLATE LEVEL  CBC    EKG  EKG Interpretation None       Radiology No results found.  Procedures Procedures (including critical care time)  Medications Ordered in ED Medications    LORazepam (ATIVAN) injection 0-4 mg (not administered)    Or  LORazepam (ATIVAN) tablet 0-4 mg (not administered)  LORazepam (ATIVAN) injection 0-4 mg (not administered)    Or  LORazepam (ATIVAN) tablet 0-4 mg (not administered)  thiamine (VITAMIN B-1) tablet 100 mg (not administered)    Or  thiamine (B-1) injection 100 mg (not administered)  ondansetron (ZOFRAN) tablet 4 mg (not administered)  acetaminophen (TYLENOL) tablet 650 mg (not administered)     Initial Impression / Assessment and Plan / ED Course  I have reviewed the triage vital signs and the nursing notes.  Pertinent labs & imaging results that were available during my care of the patient were reviewed by me and considered in my medical decision making (see chart for details).    This  is a 36 y.o. Male with history of alcohol abuse who presents to the emergency department from behavioral health for medical clearance and requesting alcohol detox. Patient tells me he went to behavioral health tonight requesting  detox from alcohol. He was sent to the emergency department for medical clearance. He is to be evaluated by psychiatry in the morning. Patient tells me he last went through alcohol detox 2 months ago. He was sober for 11 days. He's been drinking daily since. His last check was around 7 PM this evening or about 8 hours prior to my evaluation. He reports a history of seizures with alcohol withdrawal back in 2012. He denies physical complaints. He denies suicidal homicidal ideations.  On exam patient is afebrile and nontoxic appearing. No hand shaking. No tongue fasciculations. Is alert and oriented. Speech is clear and coherent.  On evaluation of behavioral health note they recommend a.m. psych evaluation. Sent here for medical clearance.  Blood work ears unremarkable. Alcohol level is undetectable. Urine drug screen is positive for marijuana.  Patient is medically clear for behavioral health disposition. Psychiatric  holding orders placed. CIWA orders initiated.  We'll have psychiatry evaluate in the morning.  Final Clinical Impressions(s) / ED Diagnoses   Final diagnoses:  Alcohol abuse    New Prescriptions New Prescriptions   No medications on file     Lorene DyDansie, Lasheena Frieze, PA-C 06/13/17 0325    Molpus, Jonny RuizJohn, MD 06/13/17 [phone]

## 2017-06-13 NOTE — ED Notes (Addendum)
SBAR Report received from previous nurse. Pt received calm and visible on unit. Pt denies current SI/ HI, A/V H, depression, anxiety, or pain at this time, and appears otherwise stable and is only distressed by the fact that he is now in the psych holding area. It was explained to the pt that he is here voluntarily and that he can leave but while we are looking for a rehab bed he can get some rest back here because its quiet and dim. Pt endorses that his last drink was 7 PM and that he drinks a pint to 1.5 pints of liquor daily. pt does not endorse any detox symptoms at this time, and was encouraged to come to staff once they begin. Pt reminded of camera surveillance, q 15 min rounds, and rules of the milieu. Will continue to assess.

## 2017-06-13 NOTE — BH Assessment (Signed)
Patient evaluated by Dr. Jannifer FranklinAkintayo and Nanine MeansJamison Lord, DNP on this day. The providers determined that patient is psychiatrically cleared and appropriate to discharge with outpatient referrals. Patient provided with discharge information to facilities for substance abuse facilities. Patient specified that he wants to go to a long term facility such as [facility]. Writer encouraged patient to follow up with those referrals. Patient was agreeable to follow up.

## 2017-06-13 NOTE — ED Notes (Signed)
Dr. Read DriversMolpus was notified about patient

## 2017-06-13 NOTE — [facility] Suicide Risk Assessment (Signed)
Suicide Risk Assessment  Discharge Assessment   [facility]   Principal Problem: Substance induced mood disorder [facility](HCC) Discharge Diagnoses:  Patient Active Problem List   Diagnosis Date Noted  . Cannabis abuse [F12.10] 06/13/2017    Priority: High  . Substance induced mood disorder [facility](HCC) [F19.94] 06/13/2017    Priority: High    Total Time spent with patient: 45 minutes  Musculoskeletal: Strength & Muscle Tone: within normal limits Gait & Station: normal Patient leans: N/A  Psychiatric Specialty Exam: Physical Exam  Constitutional: He is oriented to person, place, and time. He appears well-developed and well-nourished.  HENT:  Head: Normocephalic.  Neck: Normal range of motion.  Respiratory: Effort normal.  Musculoskeletal: Normal range of motion.  Neurological: He is alert and oriented to person, place, and time.  Psychiatric: He has a normal mood and affect. His speech is normal and behavior is normal. Judgment and thought content normal. Cognition and memory are normal.    Review of Systems  Psychiatric/Behavioral: Positive for substance abuse.  All other systems reviewed and are negative.   Blood pressure (!) 142/92, pulse 85, temperature 97.6 F (36.4 C), temperature source Oral, resp. rate 16, SpO2 100 %.There is no height or weight on file to calculate BMI.  General Appearance: Casual  Eye Contact:  Good  Speech:  Normal Rate  Volume:  Normal  Mood:  Irritable  Affect:  Congruent  Thought Process:  Coherent and Descriptions of Associations: Intact  Orientation:  Full (Time, Place, and Person)  Thought Content:  WDL and Logical  Suicidal Thoughts:  No  Homicidal Thoughts:  No  Memory:  Immediate;   Good Recent;   Good Remote;   Good  Judgement:  Fair  Insight:  Fair  Psychomotor Activity:  Normal  Concentration:  Concentration: Good and Attention Span: Good  Recall:  Good  Fund of Knowledge:  Good  Language:  Good  Akathisia:   No  Handed:  Right  AIMS (if indicated):     Assets:  Leisure Time Physical Health Resilience Social Support  ADL's:  Intact  Cognition:  WNL  Sleep:      Mental Status Per Nursing Assessment::   On Admission:   substance abuse, requesting [facility] referral  Demographic Factors:  Male  Loss Factors: Legal issues  Historical Factors: NA  Risk Reduction Factors:   Sense of responsibility to family, Living with another person, especially a relative and Positive social support  Continued Clinical Symptoms:  Irritable  Cognitive Features That Contribute To Risk:  None    Suicide Risk:  Minimal: No identifiable suicidal ideation.  Patients presenting with no risk factors but with morbid ruminations; may be classified as minimal risk based on the severity of the depressive symptoms    Plan Of Care/Follow-up recommendations:  Activity:  as tolerated Diet:  heart healhty diet  Leonard Clymer, NP 06/13/2017, 10:32 AM

## 2017-07-04 ENCOUNTER — Emergency Department (HOSPITAL_BASED_OUTPATIENT_CLINIC_OR_DEPARTMENT_OTHER)
Admission: EM | Admit: 2017-07-04 | Discharge: 2017-07-04 | Disposition: A | Discharge status: Home / Self Care | Payer: Medicaid Other | Attending: Emergency Medicine | Admitting: Emergency Medicine

## 2017-07-04 ENCOUNTER — Encounter (HOSPITAL_BASED_OUTPATIENT_CLINIC_OR_DEPARTMENT_OTHER): Payer: Self-pay | Admitting: *Deleted

## 2017-07-04 DIAGNOSIS — K0889 Other specified disorders of teeth and supporting structures: Secondary | ICD-10-CM

## 2017-07-04 DIAGNOSIS — K047 Periapical abscess without sinus: Secondary | ICD-10-CM | POA: Insufficient documentation

## 2017-07-04 DIAGNOSIS — F17210 Nicotine dependence, cigarettes, uncomplicated: Secondary | ICD-10-CM | POA: Insufficient documentation

## 2017-07-04 DIAGNOSIS — Z79899 Other long term (current) drug therapy: Secondary | ICD-10-CM | POA: Insufficient documentation

## 2017-07-04 MED ORDER — IBUPROFEN 600 MG PO TABS: 600.0000 mg | ORAL_TABLET | Freq: Four times a day (QID) | ORAL | 0 refills | Status: DC | PRN

## 2017-07-04 MED ORDER — PENICILLIN V POTASSIUM 500 MG PO TABS: 500.0000 mg | ORAL_TABLET | Freq: Four times a day (QID) | ORAL | 0 refills | Status: AC

## 2017-07-04 MED ORDER — CHLORHEXIDINE GLUCONATE 0.12 % MT SOLN: 15.0000 mL | Freq: Two times a day (BID) | OROMUCOSAL | 0 refills | Status: DC

## 2017-07-04 NOTE — ED Triage Notes (Signed)
Dental pain. He is in [facility] re[facility]. They are limited in medications that he is able to take while at their facility.

## 2017-07-04 NOTE — Discharge Instructions (Signed)
Take antibiotics as directed. Please take all of your antibiotics until finished.  You can take Ibuprofen as directed for pain.  Use the mouth was instructed.  Follow-up with the dentist as previously scheduled. If he cannot follow up with him, I provided a dental resource guide that has outpatient dentist available.  Return the emergency Department for any worsening pain, fever, facial swelling or redness, difficulty swelling, difficulty eating or any other worsening or concerning symptoms.

## 2017-07-04 NOTE — ED Provider Notes (Signed)
[facility] Provider Note   CSN: [id number] Arrival date & time: 07/04/17  1358     History   Chief Complaint Chief Complaint  Patient presents with  . Dental Pain    HPI Leonard [name] is a 36 y.o. male who presents with 4 days of right-sided dental pain. Patient reports that he has not been able to take any medications for the pain. [facility] and he is not allowed taking any medications without prescription. He has not tried any other therapies. Patient reports some mild sided facial swelling. No warmth or erythema noted. Patient states that he has been able to swallow without any difficulty. He has been able to eat and drink without any difficulty. Patient denies any fever, vomiting, difficulty breathing.  The history is provided by the patient.    Past Medical History:  Diagnosis Date  . Alcohol abuse   . Seizures (HCC)    while detoxing    Patient Active Problem List   Diagnosis Date Noted  . Cannabis abuse 06/13/2017  . Substance induced mood disorder (HCC) 06/13/2017    History reviewed. No pertinent surgical history.     Home Medications    Prior to Admission medications   Medication Sig Start Date End Date Taking? Authorizing Provider  acamprosate (CAMPRAL) 333 MG tablet Take 2 tablets (666 mg total) by mouth 3 (three) times daily with meals. Patient not taking: Reported on 06/13/2017 04/08/17   Adonis Brook, NP  chlorhexidine (PERIDEX) 0.12 % solution Use as directed 15 mLs in the mouth or throat 2 (two) times daily. 07/04/17   Maxwell Caul, PA-C  diclofenac (VOLTAREN) 50 MG EC tablet Take 1 tablet (50 mg total) by mouth 2 (two) times daily. Patient taking differently: Take 50 mg by mouth 2 (two) times daily as needed for mild pain or moderate pain.  04/08/17   Adonis Brook, NP  DULoxetine (CYMBALTA) 20 MG capsule Take 1 capsule (20 mg total) by mouth 2 (two) times daily. Patient not taking:  Reported on 06/13/2017 04/08/17   Adonis Brook, NP  gabapentin (NEURONTIN) 300 MG capsule Take 1 capsule (300 mg total) by mouth 3 (three) times daily. 06/13/17   Charm Rings, NP  hydrOXYzine (ATARAX/VISTARIL) 25 MG tablet Take 1 tablet (25 mg total) by mouth every 6 (six) hours as needed for anxiety. Patient not taking: Reported on 06/13/2017 04/08/17   Adonis Brook, NP  ibuprofen (ADVIL,MOTRIN) 600 MG tablet Take 1 tablet (600 mg total) by mouth every 6 (six) hours as needed. 07/04/17   Maxwell Caul, PA-C  nicotine (NICODERM CQ - DOSED IN MG/24 HR) 7 mg/24hr patch Place 1 patch (7 mg total) onto the skin daily. Patient not taking: Reported on 06/13/2017 04/09/17   Adonis Brook, NP  penicillin v potassium (VEETID) 500 MG tablet Take 1 tablet (500 mg total) by mouth 4 (four) times daily. 07/04/17 07/11/17  Maxwell Caul, PA-C  traZODone (DESYREL) 50 MG tablet Take 1 tablet (50 mg total) by mouth at bedtime as needed for sleep. Patient not taking: Reported on 06/13/2017 04/08/17   Adonis Brook, NP    Family History Family History  Problem Relation Age of Onset  . Cancer Other   . Hypertension Other     Social History Social History  Substance Use Topics  . Smoking status: Current Some Day Smoker    Packs/day: 0.25    Types: Cigarettes  . Smokeless tobacco: Never Used  .  Alcohol use 42.0 oz/week    70 Shots of liquor per week     Comment: 2 pints cognac weekly, last drink 3/17     Allergies   Patient has no known allergies.   Review of Systems Review of Systems  Constitutional: Negative for fever.  HENT: Positive for dental problem and facial swelling. Negative for drooling and trouble swallowing.   Respiratory: Negative for shortness of breath.   Cardiovascular: Negative for chest pain.  Gastrointestinal: Negative for nausea and vomiting.     Physical Exam Updated Vital Signs BP (!) 122/91   Pulse 71   Temp 98.6 F (37 C) (Oral)   Resp 20   Ht 6\' 1"   (1.854 m)   Wt 88.5 kg (195 lb)   SpO2 99%   BMI 25.73 kg/m   Physical Exam  Constitutional: He appears well-developed and well-nourished.  Sitting comfortably on examination table  HENT:  Head: Normocephalic and atraumatic.  Mouth/Throat: Uvula is midline, oropharynx is clear and moist and mucous membranes are normal. No trismus in the jaw. Abnormal dentition. Dental abscesses present.    No facial or neck swelling. No overlying warmth, erythema. No trismus. Uvula is midline. Partial dental implant in place. Mild tenderness palpation to the right gum as documented in the graphic. There is a very small 0.5 cm area of erythema and fluctuance. Very minimal surrounding interval erythema. Noted..  Eyes: Conjunctivae and EOM are normal. Right eye exhibits no discharge. Left eye exhibits no discharge. No scleral icterus.  Neck: Normal range of motion.  No neck swelling  Pulmonary/Chest: Effort normal.  Neurological: He is alert.  Skin: Skin is warm and dry.  Psychiatric: He has a normal mood and affect. His speech is normal and behavior is normal.  Nursing note and vitals reviewed.    ED Treatments / Results  Labs (all labs ordered are listed, but only abnormal results are displayed) Labs Reviewed - No data to display  EKG  EKG Interpretation None       Radiology No results found.  Procedures Procedures (including critical care time)  Medications Ordered in ED Medications - No data to display   Initial Impression / Assessment and Plan / ED Course  I have reviewed the triage vital signs and the nursing notes.  Pertinent labs & imaging results that were available during my care of the patient were reviewed by me and considered in my medical decision making (see chart for details).     [age] year old male who presents with 4 days of dental pain. Also with reports of mild right-sided facial swelling. No reported fevers. No difficulty swallowing, vomiting. No difficulty  breathing. Patient is afebrile, non-toxic appearing, sitting comfortably on examination table. Vital signs reviewed and stable. No trismus on exam. No facial swelling or warmth or erythema. Physical exam shows a very small area of fluctuance overlying the top right gum of the last molar, tooth number 32. Discussed with patient's regarding possible I&D for potential abscess. Patient does not wish to have I&D at this time. Discussed all risks and benefits, including but not limited to worsening abscess, infection, worsening pain. Patient asked versus full understanding of all risks and benefits and chooses to decline I&D at this time. Will plan to treat with antibiotic therapy. Patient is scheduled for a dentist appointment on 07/11/17. Will plan to also provide an dental resource guide for further evaluation. Strict return precautions discussed. Patient's breasts understanding and agreement.  Final Clinical Impressions(s) / ED Diagnoses  Final diagnoses:  Pain, dental  Dental abscess    New Prescriptions Discharge Medication List as of 07/04/2017  3:27 PM    START taking these medications   Details  chlorhexidine (PERIDEX) 0.12 % solution Use as directed 15 mLs in the mouth or throat 2 (two) times daily., Starting Mon 07/04/2017, Print    ibuprofen (ADVIL,MOTRIN) 600 MG tablet Take 1 tablet (600 mg total) by mouth every 6 (six) hours as needed., Starting Mon 07/04/2017, Print    penicillin v potassium (VEETID) 500 MG tablet Take 1 tablet (500 mg total) by mouth 4 (four) times daily., Starting Mon 07/04/2017, Until Mon 07/11/2017, Print         Maxwell Caul, PA-C 07/04/17 1729    Arby Barrette, MD 07/14/17 0010

## 2018-08-30 ENCOUNTER — Other Ambulatory Visit: Payer: Self-pay

## 2018-08-30 ENCOUNTER — Ambulatory Visit (HOSPITAL_COMMUNITY)
Admission: RE | Admit: 2018-08-30 | Discharge: 2018-08-30 | Disposition: A | Discharge status: Home / Self Care | Payer: No Typology Code available for payment source | Attending: Psychiatry | Admitting: Psychiatry

## 2018-08-30 ENCOUNTER — Encounter (HOSPITAL_COMMUNITY): Payer: Self-pay | Admitting: Emergency Medicine

## 2018-08-30 ENCOUNTER — Emergency Department (HOSPITAL_COMMUNITY)
Admission: EM | Admit: 2018-08-30 | Discharge: 2018-08-30 | Disposition: A | Discharge status: Home / Self Care | Payer: Medicaid Other | Attending: Emergency Medicine | Admitting: Emergency Medicine

## 2018-08-30 DIAGNOSIS — F17210 Nicotine dependence, cigarettes, uncomplicated: Secondary | ICD-10-CM | POA: Insufficient documentation

## 2018-08-30 DIAGNOSIS — F1010 Alcohol abuse, uncomplicated: Secondary | ICD-10-CM | POA: Insufficient documentation

## 2018-08-30 DIAGNOSIS — F1410 Cocaine abuse, uncomplicated: Secondary | ICD-10-CM | POA: Insufficient documentation

## 2018-08-30 DIAGNOSIS — F1210 Cannabis abuse, uncomplicated: Secondary | ICD-10-CM | POA: Insufficient documentation

## 2018-08-30 DIAGNOSIS — F331 Major depressive disorder, recurrent, moderate: Secondary | ICD-10-CM | POA: Insufficient documentation

## 2018-08-30 DIAGNOSIS — Z79899 Other long term (current) drug therapy: Secondary | ICD-10-CM | POA: Insufficient documentation

## 2018-08-30 DIAGNOSIS — F329 Major depressive disorder, single episode, unspecified: Secondary | ICD-10-CM | POA: Insufficient documentation

## 2018-08-30 LAB — RAPID URINE DRUG SCREEN, HOSP PERFORMED
Amphetamines: NOT DETECTED
BENZODIAZEPINES: NOT DETECTED
Barbiturates: NOT DETECTED
COCAINE: NOT DETECTED
Opiates: NOT DETECTED
Tetrahydrocannabinol: POSITIVE — AB

## 2018-08-30 LAB — COMPREHENSIVE METABOLIC PANEL
ALK PHOS: 43 U/L (ref 38–126)
ALT: 19 U/L (ref 0–44)
AST: 21 U/L (ref 15–41)
Albumin: 4.1 g/dL (ref 3.5–5.0)
Anion gap: 8 (ref 5–15)
BUN: 11 mg/dL (ref 6–20)
CALCIUM: 8.8 mg/dL — AB (ref 8.9–10.3)
CO2: 26 mmol/L (ref 22–32)
CREATININE: 1.23 mg/dL (ref 0.61–1.24)
Chloride: 107 mmol/L (ref 98–111)
Glucose, Bld: 83 mg/dL (ref 70–99)
Potassium: 4 mmol/L (ref 3.5–5.1)
SODIUM: 141 mmol/L (ref 135–145)
TOTAL PROTEIN: 7 g/dL (ref 6.5–8.1)
Total Bilirubin: 0.3 mg/dL (ref 0.3–1.2)

## 2018-08-30 LAB — CBC
HCT: 44.8 % (ref 39.0–52.0)
Hemoglobin: 14.7 g/dL (ref 13.0–17.0)
MCH: 31.6 pg (ref 26.0–34.0)
MCHC: 32.8 g/dL (ref 30.0–36.0)
MCV: 96.3 fL (ref 80.0–100.0)
PLATELETS: 219 10*3/uL (ref 150–400)
RBC: 4.65 MIL/uL (ref 4.22–5.81)
RDW: 12.5 % (ref 11.5–15.5)
WBC: 5.1 10*3/uL (ref 4.0–10.5)
nRBC: 0 % (ref 0.0–0.2)

## 2018-08-30 LAB — ETHANOL

## 2018-08-30 NOTE — Patient Outreach (Signed)
ED Peer Support Specialist Patient Intake (Complete at intake & 30-60 Day Follow-up)  Name: Leonard [name]  MRN: [id number]  Age: 37 y.o.   Date of Admission: 08/30/2018  Intake: Initial Comments:      Primary Reason Admitted:  male who presents to [facility] as a walk in with a primary request for detox from alcohol. Pt also reports increased depression as tomorrow will be the first anniversary of his mother's death. Pt is currently seeing a counselor through hospice and says she has been really helpful. Pt denies SI, HI, or AVH. Pt reports having a lot of anger and HI towards his brothers b/c of how they've treated his mother's home since her death, but denies intent or plan of homicide.     Lab values: Alcohol/ETOH: Not completed Positive UDS? Drug screen not completed Amphetamines: Drug screen not completed Barbiturates: Drug screen not completed Benzodiazepines: Drug screen not completed Cocaine: Drug screen not completed Opiates: Drug screen not completed Cannabinoids: Drug screen not completed  Demographic information: Gender: Male Ethnicity: African American Marital Status: Married Insurance Status: Uninsured/Self-pay Ecologist (Work Neurosurgeon, Physicist, medical, etc.: No Lives with: Partner/Spouse Living situation: House/Apartment  Reported Patient History: Patient reported health conditions: Anxiety disorders, Depression Patient aware of HIV and hepatitis status: No  In past year, has patient visited ED for any reason? No  Number of ED visits:    Reason(s) for visit:    In past year, has patient been hospitalized for any reason? No  Number of hospitalizations:    Reason(s) for hospitalization:    In past year, has patient been arrested? No  Number of arrests:    Reason(s) for arrest:    In past year, has patient been incarcerated? No  Number of incarcerations:    Reason(s) for incarceration:    In past year, has patient  received medication-assisted treatment? No  In past year, patient received the following treatments:    In past year, has patient received any harm reduction services? No  Did this include any of the following?    In past year, has patient received care from a mental health provider for diagnosis other than SUD? No  In past year, is this first time patient has overdosed? No  Number of past overdoses:    In past year, is this first time patient has been hospitalized for an overdose? No  Number of hospitalizations for overdose(s):    Is patient currently receiving treatment for a mental health diagnosis? No  Patient reports experiencing difficulty participating in SUD treatment: No    Most important reason(s) for this difficulty?    Has patient received prior services for treatment? No  In past, patient has received services from following agencies:    Plan of Care:  Suggested follow up at these agencies/treatment centers: [facility] [place])  Other information: CPSS met with Pt and was able to complete the series of question for Pt. CPSS processed with Pt and was able to gain information to better assist Pt. CPSS John talked with Pt and offered resources for Pt to have for outside services and updated AA group list. CPSS was able to get a consent formed signed an are waiting for Pt to complete urine sample.    Aaron Edelman Carrie Schoonmaker, CPSS  08/30/2018 2:58 PM

## 2018-08-30 NOTE — ED Notes (Signed)
Pt does not feel need to urinate yet

## 2018-08-30 NOTE — Discharge Instructions (Addendum)
Please return for any problem. Follow up as instructed.  °

## 2018-08-30 NOTE — ED Notes (Signed)
Pt very agitated at d/c. Pt crying and stated "I feel like the doctor just doesn't care. In two days when I have a seizure in front of my kids from trying not to drink, who is going to be there then? At least It seemed like Amber cared. I feel like I have made a fool of myself crying in front of people here for them to just tell me to follow up with resources." RN attempted to calm pt, but pt was too agitated to calm down.

## 2018-08-30 NOTE — BH Assessment (Addendum)
Assessment Note  Leonard [name] is a 37 y.o. male who presents to [facility] as a walk in with a primary request for detox from alcohol. Pt also reports increased depression as tomorrow will be the first anniversary of his mother's death. Pt is currently seeing a counselor through hospice and says she has been really helpful. Pt denies SI, HI, or AVH. Pt reports having a lot of anger and HI towards his brothers b/c of how they've treated his mother's home since her death, but denies intent or plan of homicide.   Case staffed with Dr. Nelly Rout, who also spoke with pt. Pt is recommended to be sent to [facility] for medical evaluation (due to his report of coffee-ground like emesis) and request for peer support to consult. Dr. Lucianne Muss also recommends a medical detox due to pt's report of seizures and DTs upon withdrawal. Pt does NOT meet criteria for IP psychiatric treatment.  Diagnosis: F10.10 Alcohol abuse, uncomplicated; F33.1 MDD, recurrent, moderate  Past Medical History:  Past Medical History:  Diagnosis Date  . Alcohol abuse   . Seizures (HCC)    while detoxing    No past surgical history on file.  Family History:  Family History  Problem Relation Age of Onset  . Cancer Other   . Hypertension Other     Social History:  reports that he has been smoking cigarettes. He has been smoking about 0.25 packs per day. He has never used smokeless tobacco. He reports that he drinks about 70.0 standard drinks of alcohol per week. He reports that he has current or past drug history. Drugs: Cocaine and Marijuana.  Additional Social History:  Alcohol / Drug Use Pain Medications: pt denies Prescriptions: pt denies Over the Counter: pt denies History of alcohol / drug use?: Yes Substance #1 Name of Substance 1: alcohol 1 - Amount (size/oz): a pint and a half of liquor 1 - Frequency: daily 1 - Duration: ongoing 1 - Last Use / Amount: this morning/ @ 2 ounces of liquor Substance #2 Name of  Substance 2: marijuana 2 - Amount (size/oz): 1 gram  2 - Frequency: daily 2 - Duration: ongoing  CIWA:   COWS:    Allergies: No Known Allergies  Home Medications:  (Not in a hospital admission)  OB/GYN Status:  No LMP for male patient.  General Assessment Data Location of Assessment: [facility] TTS Assessment: In system Is this a Tele or Face-to-Face Assessment?: Face-to-Face Is this an Initial Assessment or a Re-assessment for this encounter?: Initial Assessment Patient Accompanied by:: N/A Language Other than English: No Living Arrangements: Other (Comment) What gender do you identify as?: Male Marital status: Married Living Arrangements: Spouse/significant other, Children Can pt return to current living arrangement?: Yes Admission Status: Voluntary Is patient capable of signing voluntary admission?: Yes Referral Source: Self/Family/Friend Insurance type: none  Medical Screening Exam [facility] Walk-in ONLY) Medical Exam completed: Yes  Crisis Care Plan Living Arrangements: Spouse/significant other, Children Name of Psychiatrist: none Name of Therapist: Burna Cash [facility])  Education Status Is patient currently in school?: No Is the patient employed, unemployed or receiving disability?: Unemployed  Risk to self with the past 6 months Suicidal Ideation: No Has patient been a risk to self within the past 6 months prior to admission? : No Suicidal Intent: No Has patient had any suicidal intent within the past 6 months prior to admission? : No Is patient at risk for suicide?: No Suicidal Plan?: No Has patient had any suicidal plan  within the past 6 months prior to admission? : No Access to Means: No Previous Attempts/Gestures: No Intentional Self Injurious Behavior: None Family Suicide History: No Recent stressful life event(s): Loss (Comment) Persecutory voices/beliefs?: No Depression: Yes Depression Symptoms: Despondent, Tearfulness, Isolating,  Feeling angry/irritable Substance abuse history and/or treatment for substance abuse?: Yes Suicide prevention information given to non-admitted patients: Not applicable  Risk to Others within the past 6 months Homicidal Ideation: Yes-Currently Present Does patient have any lifetime risk of violence toward others beyond the six months prior to admission? : No Thoughts of Harm to Others: Yes-Currently Present Comment - Thoughts of Harm to Others: Pt angry with his brothers and wants to harm them. No intent or plan. Current Homicidal Intent: No Current Homicidal Plan: No Access to Homicidal Means: No History of harm to others?: No Assessment of Violence: None Noted Does patient have access to weapons?: No Criminal Charges Pending?: No Does patient have a court date: No Is patient on probation?: No  Psychosis Hallucinations: None noted Delusions: None noted  Mental Status Report Appearance/Hygiene: Unremarkable Eye Contact: Good Motor Activity: Unremarkable Speech: Logical/coherent Level of Consciousness: Alert Mood: Depressed Affect: Appropriate to circumstance Anxiety Level: Moderate Thought Processes: Coherent, Relevant Judgement: Partial Orientation: Person, Place, Time, Situation Obsessive Compulsive Thoughts/Behaviors: None  Cognitive Functioning Concentration: Normal Memory: Recent Intact, Remote Intact Is patient IDD: No Insight: Fair Impulse Control: Fair Appetite: Fair Have you had any weight changes? : No Change Sleep: No Change Vegetative Symptoms: None  ADLScreening [facility] Assessment Services) Patient's cognitive ability adequate to safely complete daily activities?: Yes Patient able to express need for assistance with ADLs?: Yes Independently performs ADLs?: Yes (appropriate for developmental age)  Prior Inpatient Therapy Prior Inpatient Therapy: Yes Prior Therapy Dates: 08/2015 & 03/2017 Prior Therapy Facilty/Provider(s): [facility] Reason for  Treatment: detox  Prior Outpatient Therapy Prior Outpatient Therapy: No Does patient have an ACCT team?: No Does patient have Intensive In-House Services?  : No Does patient have [facility] services? : No Does patient have P4CC services?: No  ADL Screening (condition at time of admission) Patient's cognitive ability adequate to safely complete daily activities?: Yes Is the patient deaf or have difficulty hearing?: No Does the patient have difficulty seeing, even when wearing glasses/contacts?: No Does the patient have difficulty concentrating, remembering, or making decisions?: No Patient able to express need for assistance with ADLs?: Yes Does the patient have difficulty dressing or bathing?: No Independently performs ADLs?: Yes (appropriate for developmental age) Does the patient have difficulty walking or climbing stairs?: No Weakness of Legs: None Weakness of Arms/Hands: None  Home Assistive Devices/Equipment Home Assistive Devices/Equipment: None    Abuse/Neglect Assessment (Assessment to be complete while patient is alone) Abuse/Neglect Assessment Can Be Completed: Yes Physical Abuse: Denies Verbal Abuse: Denies Sexual Abuse: Denies Exploitation of patient/patient's resources: Denies Self-Neglect: Denies Values / Beliefs Cultural Requests During Hospitalization: None Spiritual Requests During Hospitalization: None   Advance Directives (For Healthcare) Does Patient Have a Medical Advance Directive?: No          Disposition:  Disposition Initial Assessment Completed for this Encounter: Yes Disposition of Patient: Movement to [facility] or [facility] ED Patient refused recommended treatment: No Mode of transportation if patient is discharged?: N/A Patient referred to: Other (Comment)(Peer support & medical detox)  On Site Evaluation by:   Reviewed with Physician:    Laddie Aquas 08/30/2018 9:34 AM

## 2018-08-30 NOTE — H&P (Signed)
Behavioral Health Medical Screening Exam  Leonard [name] is an 37 y.o. male who presented to be [facility] as a walk-in reporting that he wanted detox.  Patient states that he has been drinking for a long time, threw up last night and again this morning and reports that it was coffee-ground emesis.  Patient states that he has a history of withdrawal seizures, wants detox from alcohol.  Patient reports that he is also depressed, lost his mother and is still going for counseling.  Patient adds that he feels lightheaded at times, drinks heavily, wants to go to a detox and rehab facility so he can get off alcohol.  Patient denies any thoughts of self-harm, reports that he is gets irritated easily when he is upset, he does have thoughts of hurting others.  Patient reports that he has difficulty in getting out of bed due to his drinking, gets anxious at times, wants to be alone.  Patient denies any hallucinations, paranoia  Total Time spent with patient: 20 minutes  Psychiatric Specialty Exam: Physical Exam  Review of Systems  Constitutional: Positive for malaise/fatigue. Negative for fever and weight loss.  HENT: Negative for congestion and sore throat.   Respiratory: Negative.  Negative for cough and wheezing.   Cardiovascular: Negative for chest pain and palpitations.  Gastrointestinal: Positive for abdominal pain, diarrhea, heartburn, nausea and vomiting.  Musculoskeletal: Positive for myalgias. Negative for falls and joint pain.  Skin: Negative for rash.  Neurological: Positive for dizziness. Negative for loss of consciousness and headaches.       History of withdrawal seizures  Psychiatric/Behavioral: Positive for depression and substance abuse. Negative for hallucinations and suicidal ideas. The patient is nervous/anxious.     Temperature 98.3, pulse rate 65, blood pressure 118 / 87  General Appearance: Disheveled  Eye Contact:  Fair  Speech:  Clear and Coherent and Normal Rate  Volume:   Decreased  Mood:  Depressed, Dysphoric, Hopeless and Worthless  Affect:  Appropriate and Congruent  Thought Process:  Coherent and Descriptions of Associations: Intact  Orientation:  Full (Time, Place, and Person)  Thought Content:  Logical, Hallucinations: None and Rumination  Suicidal Thoughts:  No  Homicidal Thoughts:  No  Memory:  Immediate;   Fair Recent;   Fair Remote;   Fair  Judgement:  Poor  Insight:  Shallow  Psychomotor Activity:  Mannerisms  Concentration: Concentration: Fair and Attention Span: Fair  Recall:  [employer] of Knowledge:Fair  Language: Fair  Akathisia:  No  Handed:  Right  AIMS (if indicated):     Assets:  Communication Skills Desire for Improvement Social Support  Sleep:       Musculoskeletal: Strength & Muscle Tone: within normal limits Gait & Station: unsteady Patient leans: N/A  There were no vitals taken for this visit.  Recommendations:  Based on my evaluation the patient appears to have an emergency medical condition for which I recommend the patient be transferred to the emergency department for further evaluation.  Nelly Rout, MD 08/30/2018, 10:10 AM

## 2018-08-30 NOTE — ED Triage Notes (Signed)
Pt verbalizes ETOH use related to "mom passing a year ago tomorrow." Pt denies SI but endorses depression. "Drank this morning so I wouldn't get sick." Pt tearful with triage.

## 2018-08-30 NOTE — ED Provider Notes (Addendum)
[facility]-EMERGENCY DEPT Provider Note   CSN: [id number] Arrival date & time: 08/30/18  4098     History   Chief Complaint Chief Complaint  Patient presents with  . Detox  . Depression    HPI Leonard [name] is a 37 y.o. male.  37 year old male with prior medical history as detailed below presents for evaluation of alcohol use and abuse.  Patient reports a long history of alcohol abuse.  He reports that he has decided that he "wants to stop."  Patient was seen by psychiatry earlier today and sent to the ED for medical evaluation and peer support evaluation.  Patient is not expressing suicidal ideation or homicidal ideation. He does not appear to be an acute threat to himself or others.   The history is provided by the patient and medical records.  Illness  This is a chronic problem. The current episode started more than 1 week ago. The problem occurs constantly. The problem has not changed since onset.Pertinent negatives include no chest pain, no abdominal pain, no headaches and no shortness of breath. Nothing aggravates the symptoms. Nothing relieves the symptoms. He has tried nothing for the symptoms.    Past Medical History:  Diagnosis Date  . Alcohol abuse   . Seizures (HCC)    while detoxing    Patient Active Problem List   Diagnosis Date Noted  . Cannabis abuse 06/13/2017  . Substance induced mood disorder (HCC) 06/13/2017    History reviewed. No pertinent surgical history.      Home Medications    Prior to Admission medications   Medication Sig Start Date End Date Taking? Authorizing Provider  LORazepam (ATIVAN PO) Take 1 tablet by mouth once.   Yes [provider]  acamprosate (CAMPRAL) 333 MG tablet Take 2 tablets (666 mg total) by mouth 3 (three) times daily with meals. Patient not taking: Reported on 08/30/2018 04/08/17   Adonis Brook, NP  chlorhexidine (PERIDEX) 0.12 % solution Use as directed 15 mLs in the mouth  or throat 2 (two) times daily. Patient not taking: Reported on 08/30/2018 07/04/17   Maxwell Caul, PA-C  diclofenac (VOLTAREN) 50 MG EC tablet Take 1 tablet (50 mg total) by mouth 2 (two) times daily. Patient not taking: Reported on 08/30/2018 04/08/17   Adonis Brook, NP  DULoxetine (CYMBALTA) 20 MG capsule Take 1 capsule (20 mg total) by mouth 2 (two) times daily. Patient not taking: Reported on 08/30/2018 04/08/17   Adonis Brook, NP  gabapentin (NEURONTIN) 300 MG capsule Take 1 capsule (300 mg total) by mouth 3 (three) times daily. Patient not taking: Reported on 08/30/2018 06/13/17   Charm Rings, NP  hydrOXYzine (ATARAX/VISTARIL) 25 MG tablet Take 1 tablet (25 mg total) by mouth every 6 (six) hours as needed for anxiety. Patient not taking: Reported on 08/30/2018 04/08/17   Adonis Brook, NP  ibuprofen (ADVIL,MOTRIN) 600 MG tablet Take 1 tablet (600 mg total) by mouth every 6 (six) hours as needed. Patient not taking: Reported on 08/30/2018 07/04/17   Maxwell Caul, PA-C  nicotine (NICODERM CQ - DOSED IN MG/24 HR) 7 mg/24hr patch Place 1 patch (7 mg total) onto the skin daily. Patient not taking: Reported on 08/30/2018 04/09/17   Adonis Brook, NP  traZODone (DESYREL) 50 MG tablet Take 1 tablet (50 mg total) by mouth at bedtime as needed for sleep. Patient not taking: Reported on 08/30/2018 04/08/17   Adonis Brook, NP    Family History Family History  Problem Relation Age of Onset  . Cancer Other   . Hypertension Other     Social History Social History   Tobacco Use  . Smoking status: Current Some Day Smoker    Packs/day: 0.25    Types: Cigarettes  . Smokeless tobacco: Never Used  Substance Use Topics  . Alcohol use: Yes    Alcohol/week: 70.0 standard drinks    Types: 70 Shots of liquor per week    Comment: 2 pints cognac weekly, last drink 3/17  . Drug use: Yes    Types: Cocaine, Marijuana     Allergies   Patient has no known allergies.   Review of  Systems Review of Systems  Respiratory: Negative for shortness of breath.   Cardiovascular: Negative for chest pain.  Gastrointestinal: Negative for abdominal pain.  Neurological: Negative for headaches.  All other systems reviewed and are negative.    Physical Exam Updated Vital Signs BP (!) 124/96   Pulse 62   Temp 97.9 F (36.6 C) (Oral)   Resp 15   Ht 6' (1.829 m)   Wt 90.7 kg   SpO2 91%   BMI 27.12 kg/m   Physical Exam  Constitutional: He is oriented to person, place, and time. He appears well-developed and well-nourished. No distress.  HENT:  Head: Normocephalic and atraumatic.  Mouth/Throat: Oropharynx is clear and moist.  Eyes: Pupils are equal, round, and reactive to light. Conjunctivae and EOM are normal.  Neck: Normal range of motion. Neck supple.  Cardiovascular: Normal rate, regular rhythm and normal heart sounds.  Pulmonary/Chest: Effort normal and breath sounds normal. No respiratory distress.  Abdominal: Soft. He exhibits no distension. There is no tenderness.  Musculoskeletal: Normal range of motion. He exhibits no edema or deformity.  Neurological: He is alert and oriented to person, place, and time.  Skin: Skin is warm and dry.  Psychiatric: He has a normal mood and affect.  Nursing note and vitals reviewed.    ED Treatments / Results  Labs (all labs ordered are listed, but only abnormal results are displayed) Labs Reviewed  COMPREHENSIVE METABOLIC PANEL - Abnormal; Notable for the following components:      Result Value   Calcium 8.8 (*)    All other components within normal limits  ETHANOL  CBC  RAPID URINE DRUG SCREEN, HOSP PERFORMED    EKG None  Radiology No results found.  Procedures Procedures (including critical care time)  Medications Ordered in ED Medications - No data to display   Initial Impression / Assessment and Plan / ED Course  I have reviewed the triage vital signs and the nursing notes.  Pertinent labs &  imaging results that were available during my care of the patient were reviewed by me and considered in my medical decision making (see chart for details).     MDM  Screen complete  Patient is presenting for medical screening with a reported history of significant alcohol abuse.  No acute medical issue diagnosed during this visit.  Patient without evidence of acute alcohol withdrawal while in the ED.  Patient does not require inpatient treatment at this time.   Peer support consult is complete.  Patient understands the need for close follow-up. Strict return precautions given and understood.   Final Clinical Impressions(s) / ED Diagnoses   Final diagnoses:  Alcohol abuse    ED Discharge Orders    None       Wynetta Fines, MD 08/30/18 1555    Wynetta Fines,  MD 08/30/18 1559

## 2019-09-17 ENCOUNTER — Other Ambulatory Visit: Payer: Self-pay

## 2019-09-17 ENCOUNTER — Emergency Department (HOSPITAL_COMMUNITY): Payer: Medicaid Other

## 2019-09-17 ENCOUNTER — Encounter (HOSPITAL_COMMUNITY): Payer: Self-pay | Admitting: Emergency Medicine

## 2019-09-17 ENCOUNTER — Emergency Department (HOSPITAL_COMMUNITY)
Admission: EM | Admit: 2019-09-17 | Discharge: 2019-09-17 | Disposition: A | Discharge status: Home / Self Care | Payer: Medicaid Other | Attending: Emergency Medicine | Admitting: Emergency Medicine

## 2019-09-17 DIAGNOSIS — S62115A Nondisplaced fracture of triquetrum [cuneiform] bone, left wrist, initial encounter for closed fracture: Secondary | ICD-10-CM | POA: Insufficient documentation

## 2019-09-17 DIAGNOSIS — Z79899 Other long term (current) drug therapy: Secondary | ICD-10-CM | POA: Insufficient documentation

## 2019-09-17 DIAGNOSIS — F17210 Nicotine dependence, cigarettes, uncomplicated: Secondary | ICD-10-CM | POA: Diagnosis not present

## 2019-09-17 DIAGNOSIS — Y999 Unspecified external cause status: Secondary | ICD-10-CM | POA: Insufficient documentation

## 2019-09-17 DIAGNOSIS — Y939 Activity, unspecified: Secondary | ICD-10-CM | POA: Diagnosis not present

## 2019-09-17 DIAGNOSIS — Y929 Unspecified place or not applicable: Secondary | ICD-10-CM | POA: Insufficient documentation

## 2019-09-17 DIAGNOSIS — X58XXXA Exposure to other specified factors, initial encounter: Secondary | ICD-10-CM | POA: Insufficient documentation

## 2019-09-17 DIAGNOSIS — R569 Unspecified convulsions: Secondary | ICD-10-CM | POA: Insufficient documentation

## 2019-09-17 DIAGNOSIS — S6982XA Other specified injuries of left wrist, hand and finger(s), initial encounter: Secondary | ICD-10-CM | POA: Diagnosis present

## 2019-09-17 MED ORDER — OXYCODONE-ACETAMINOPHEN 5-325 MG PO TABS
1.0000 | ORAL_TABLET | Freq: Once | ORAL | Status: AC
  Administered 2019-09-17: 1 via ORAL
  Filled 2019-09-17: qty 1

## 2019-09-17 MED ORDER — ACETAMINOPHEN 325 MG PO TABS: 650.0000 mg | ORAL_TABLET | Freq: Four times a day (QID) | ORAL | Status: DC

## 2019-09-17 MED ORDER — IBUPROFEN 200 MG PO TABS: 600.0000 mg | ORAL_TABLET | Freq: Four times a day (QID) | ORAL | Status: DC

## 2019-09-17 MED ORDER — OXYCODONE-ACETAMINOPHEN 5-325 MG PO TABS: 1.0000 | ORAL_TABLET | Freq: Four times a day (QID) | ORAL | 0 refills | Status: DC | PRN

## 2019-09-17 MED ORDER — TRAMADOL HCL 50 MG PO TABS: 50.0000 mg | ORAL_TABLET | Freq: Four times a day (QID) | ORAL | 0 refills | Status: AC | PRN

## 2019-09-17 NOTE — Consult Note (Signed)
Reason for Consult:Wrist fx Referring Physician: Tagg [name] is an 38 y.o. male.  HPI: Leonard [name] tripped on his son's skateboard on Saturday and fell backwards, breaking his fall with his left hand. He had immediate pain but could not seek care 2/2 child care issues. He had a splint from 4y ago and used that until he could come in today. X-rays showed a triquetral fx and hand surgery was consulted. He is RHD and currently unemployed.  Past Medical History:  Diagnosis Date  . Alcohol abuse   . Seizures (HCC)    while detoxing    History reviewed. No pertinent surgical history.  Family History  Problem Relation Age of Onset  . Cancer Other   . Hypertension Other     Social History:  reports that he has been smoking cigarettes. He has been smoking about 0.25 packs per day. He has never used smokeless tobacco. He reports current alcohol use of about 70.0 standard drinks of alcohol per week. He reports current drug use. Drugs: Cocaine and Marijuana.  Allergies: No Known Allergies  Medications: I have reviewed the patient's current medications.  No results found for this or any previous visit (from the past 48 hour(s)).  Dg Hand Complete Left  Result Date: 09/17/2019 CLINICAL DATA:  Pain following recent fall EXAM: LEFT HAND - COMPLETE 3+ VIEW COMPARISON:  Left wrist radiographs August 25, 2014 FINDINGS: Frontal, oblique, and lateral views were obtained. There is a small avulsion along the dorsal aspect of the triquetrum bone. No other acute fracture evident. No dislocation. Mild remodeling in the distal fifth metacarpal may be indicative of old injury in this area. Joint spaces appear normal. No erosive change. IMPRESSION: Small avulsion arising from the dorsal triquetrum. Question old injury with remodeling involving the distal fifth metacarpal. No dislocation. No appreciable arthropathy. Electronically Signed   By: Bretta Bang III M.D.   On: 09/17/2019 09:18     Review of Systems  Constitutional: Negative for weight loss.  HENT: Negative for ear discharge, ear pain, hearing loss and tinnitus.   Eyes: Negative for blurred vision, double vision, photophobia and pain.  Respiratory: Negative for cough, sputum production and shortness of breath.   Cardiovascular: Negative for chest pain.  Gastrointestinal: Negative for abdominal pain, nausea and vomiting.  Genitourinary: Negative for dysuria, flank pain, frequency and urgency.  Musculoskeletal: Positive for joint pain (Left wrist). Negative for back pain, falls, myalgias and neck pain.  Neurological: Negative for dizziness, tingling, sensory change, focal weakness, loss of consciousness and headaches.  Endo/Heme/Allergies: Does not bruise/bleed easily.  Psychiatric/Behavioral: Negative for depression, memory loss and substance abuse. The patient is not nervous/anxious.    Blood pressure (!) 145/77, pulse 77, temperature 98.5 F (36.9 C), temperature source Oral, resp. rate 18, height 6\' 2"  (1.88 m), weight 93 kg, SpO2 100 %. Physical Exam  Constitutional: He appears well-developed and well-nourished. No distress.  HENT:  Head: Normocephalic and atraumatic.  Eyes: Conjunctivae are normal. Right eye exhibits no discharge. Left eye exhibits no discharge. No scleral icterus.  Neck: Normal range of motion.  Cardiovascular: Normal rate and regular rhythm.  Respiratory: Effort normal. No respiratory distress.  Musculoskeletal:     Comments: Left shoulder, elbow, wrist, digits- no skin wounds, mod TTP dorsum of wrist, no deformity, no instability, no blocks to motion  Sens  Ax/R/M/U intact  Mot   Ax/ R/ PIN/ M/ AIN/ U intact  Rad 2+  Neurological: He is alert.  Skin: Skin  is warm and dry. He is not diaphoretic.  Psychiatric: He has a normal mood and affect. His behavior is normal.    Assessment/Plan: Left triquetral fx -- Continue wrist splint. Dr. Grandville Silos to see before discharge. F/u as OP with  him.    Lisette Abu, PA-C Orthopedic Surgery [phone] 09/17/2019, 11:05 AM

## 2019-09-17 NOTE — ED Notes (Signed)
Patient transported to X-ray 

## 2019-09-17 NOTE — ED Triage Notes (Signed)
Pt feels that he broke his left hand 2 days ago. Pt reports tripping over his kids skateboard and all his weight fell on his left hand.

## 2019-09-17 NOTE — Progress Notes (Signed)
Orthopedic Tech Progress Note Patient Details:  Leonard [name] [birth year] [id number]  Ortho Devices Type of Ortho Device: Velcro wrist forearm splint Ortho Device/Splint Location: left Ortho Device/Splint Interventions: Application   Post Interventions Patient Tolerated: Well Instructions Provided: Care of device   [name] 09/17/2019, 11:55 AM

## 2019-09-17 NOTE — ED Notes (Signed)
Pt refused ice

## 2019-09-17 NOTE — Consult Note (Signed)
ORTHOPAEDIC CONSULTATION HISTORY & PHYSICAL REQUESTING PHYSICIAN: Arby Barrette, MD  Chief Complaint: Left wrist pain  HPI: Leonard [name] is a 38 y.o. presently unemployed male who accidentally slipped on his child's skateboard, falling backwards and landing onto an outstretched left hand.  This occurred on Saturday.  He did not have childcare yesterday, so presented to the emergency department for evaluation today.  He reports mostly dorsal left wrist pain and swelling.  He presented with a forearm-based thumb spica splint in place from a previous injury  Past Medical History:  Diagnosis Date   Alcohol abuse    Seizures (HCC)    while detoxing   History reviewed. No pertinent surgical history. Social History   Socioeconomic History   Marital status: Married    Spouse name: Not on file   Number of children: Not on file   Years of education: Not on file   Highest education level: Not on file  Occupational History   Not on file  Social Needs   Financial resource strain: Not on file   Food insecurity    Worry: Not on file    Inability: Not on file   Transportation needs    Medical: Not on file    Non-medical: Not on file  Tobacco Use   Smoking status: Current Some Day Smoker    Packs/day: 0.25    Types: Cigarettes   Smokeless tobacco: Never Used  Substance and Sexual Activity   Alcohol use: Yes    Alcohol/week: 70.0 standard drinks    Types: 70 Shots of liquor per week    Comment: 2 pints cognac weekly, last drink 3/17   Drug use: Yes    Types: Cocaine, Marijuana   Sexual activity: Yes  Lifestyle   Physical activity    Days per week: Not on file    Minutes per session: Not on file   Stress: Not on file  Relationships   Social connections    Talks on phone: Not on file    Gets together: Not on file    Attends religious service: Not on file    Active member of club or organization: Not on file    Attends meetings of clubs or  organizations: Not on file    Relationship status: Not on file  Other Topics Concern   Not on file  Social History Narrative   Not on file   Family History  Problem Relation Age of Onset   Cancer Other    Hypertension Other    No Known Allergies Prior to Admission medications   Medication Sig Start Date End Date Taking? Authorizing Provider  chlorhexidine (PERIDEX) 0.12 % solution Use as directed 15 mLs in the mouth or throat 2 (two) times daily. Patient not taking: Reported on 08/30/2018 07/04/17   Maxwell Caul, PA-C  diclofenac (VOLTAREN) 50 MG EC tablet Take 1 tablet (50 mg total) by mouth 2 (two) times daily. Patient not taking: Reported on 08/30/2018 04/08/17   Adonis Brook, NP  gabapentin (NEURONTIN) 300 MG capsule Take 1 capsule (300 mg total) by mouth 3 (three) times daily. Patient not taking: Reported on 08/30/2018 06/13/17   Charm Rings, NP  ibuprofen (ADVIL,MOTRIN) 600 MG tablet Take 1 tablet (600 mg total) by mouth every 6 (six) hours as needed. Patient not taking: Reported on 08/30/2018 07/04/17   Graciella Freer A, PA-C  LORazepam (ATIVAN PO) Take 1 tablet by mouth once.    [provider]   Dg Hand Complete  Left  Result Date: 09/17/2019 CLINICAL DATA:  Pain following recent fall EXAM: LEFT HAND - COMPLETE 3+ VIEW COMPARISON:  Left wrist radiographs August 25, 2014 FINDINGS: Frontal, oblique, and lateral views were obtained. There is a small avulsion along the dorsal aspect of the triquetrum bone. No other acute fracture evident. No dislocation. Mild remodeling in the distal fifth metacarpal may be indicative of old injury in this area. Joint spaces appear normal. No erosive change. IMPRESSION: Small avulsion arising from the dorsal triquetrum. Question old injury with remodeling involving the distal fifth metacarpal. No dislocation. No appreciable arthropathy. Electronically Signed   By: Lowella Grip III M.D.   On: 09/17/2019 09:18    Positive ROS:  All other systems have been reviewed and were otherwise negative with the exception of those mentioned in the HPI and as above.  Physical Exam: Vitals: Refer to EMR. Constitutional:  WD, WN, NAD HEENT:  NCAT, EOMI Neuro/Psych:  Alert & oriented to person, place, and time; appropriate mood & affect Lymphatic: No generalized extremity edema or lymphadenopathy Extremities / MSK:  The extremities are normal with respect to appearance, ranges of motion, joint stability, muscle strength/tone, sensation, & perfusion except as otherwise noted:  Left wrist is swollen dorsally, particularly dorsal ulnarly.  There is tenderness with palpation there.  No significant snuffbox tenderness to palpation nor pain with pressure on the volar scaphoid tubercle.  Wrist flexion extension is more painful than pronation and supination.  Full digital motion, MVI, and hand well-perfused  Assessment: Left wrist dorsal triquetral avulsion fracture  Plan: I discussed these findings with him and its implications.  I encouraged gentle range of motion exercises.  He will be fitted with a forearm-based removable wrist splint, without a thumb spica component, that he may use intermittently, removing it for sleep, shower, and motion exercises for the first 2 to 3 weeks then weaning as he is able.  We discussed implications to activities, with outpatient follow-up in 1 to 2 weeks.  Analgesic plan consists of Tylenol, ibuprofen, and tramadol as needed for breakthrough discomfort.  Rayvon Char Grandville Silos, [place], [place]  [address] Office: [phone] Mobile: [phone]  09/17/2019, 11:02 AM

## 2019-09-17 NOTE — ED Provider Notes (Signed)
[facility] EMERGENCY DEPARTMENT Provider Note   CSN: [id number] Arrival date & time: 09/17/19  3086     History   Chief Complaint Chief Complaint  Patient presents with  . Hand Injury    HPI Leonard [name] is a 38 y.o. male.     HPI   38 year old male presents today with complaints of left hand pain.  Patient notes 2 days ago he tripped fell on an outstretched hand causing immediate pain to the dorsal aspect of the wrist and hand.  Patient notes he had a splint at home from previous injury to his left hand and has been wearing this.  He notes pain with range of motion or palpation and no swelling to the dorsal aspect.  He denies any loss of sensation in the fingers or strength in the fingers.  Past Medical History:  Diagnosis Date  . Alcohol abuse   . Seizures (HCC)    while detoxing    Patient Active Problem List   Diagnosis Date Noted  . Cannabis abuse 06/13/2017  . Substance induced mood disorder (HCC) 06/13/2017    History reviewed. No pertinent surgical history.      Home Medications    Prior to Admission medications   Medication Sig Start Date End Date Taking? Authorizing Provider  acetaminophen (TYLENOL) 325 MG tablet Take 2 tablets (650 mg total) by mouth every 6 (six) hours. 09/17/19   Mack Hook, MD  chlorhexidine (PERIDEX) 0.12 % solution Use as directed 15 mLs in the mouth or throat 2 (two) times daily. Patient not taking: Reported on 08/30/2018 07/04/17   Maxwell Caul, PA-C  diclofenac (VOLTAREN) 50 MG EC tablet Take 1 tablet (50 mg total) by mouth 2 (two) times daily. Patient not taking: Reported on 08/30/2018 04/08/17   Adonis Brook, NP  gabapentin (NEURONTIN) 300 MG capsule Take 1 capsule (300 mg total) by mouth 3 (three) times daily. Patient not taking: Reported on 08/30/2018 06/13/17   Charm Rings, NP  ibuprofen (ADVIL) 200 MG tablet Take 3 tablets (600 mg total) by mouth every 6 (six) hours. 09/17/19    Mack Hook, MD  LORazepam (ATIVAN PO) Take 1 tablet by mouth once.    [provider]  oxyCODONE-acetaminophen (PERCOCET/ROXICET) 5-325 MG tablet Take 1 tablet by mouth every 6 (six) hours as needed for severe pain. 09/17/19   Braileigh Landenberger, Tinnie Gens, PA-C  traMADol (ULTRAM) 50 MG tablet Take 1 tablet (50 mg total) by mouth every 6 (six) hours as needed (severe pain not already relieved by tylenol and ibuprofen). 09/17/19 09/16/20  Mack Hook, MD    Family History Family History  Problem Relation Age of Onset  . Cancer Other   . Hypertension Other     Social History Social History   Tobacco Use  . Smoking status: Current Some Day Smoker    Packs/day: 0.25    Types: Cigarettes  . Smokeless tobacco: Never Used  Substance Use Topics  . Alcohol use: Yes    Alcohol/week: 70.0 standard drinks    Types: 70 Shots of liquor per week    Comment: 2 pints cognac weekly, last drink 3/17  . Drug use: Yes    Types: Cocaine, Marijuana     Allergies   Patient has no known allergies.   Review of Systems Review of Systems  All other systems reviewed and are negative.    Physical Exam Updated Vital Signs BP (!) 145/77 (BP Location: Right Arm)   Pulse 77  Temp 98.5 F (36.9 C) (Oral)   Resp 18   Ht 6\' 2"  (1.88 m)   Wt 93 kg   SpO2 100%   BMI 26.32 kg/m   Physical Exam Vitals signs and nursing note reviewed.  Constitutional:      Appearance: He is well-developed.  HENT:     Head: Normocephalic and atraumatic.  Eyes:     General: No scleral icterus.       Right eye: No discharge.        Left eye: No discharge.     Conjunctiva/sclera: Conjunctivae normal.     Pupils: Pupils are equal, round, and reactive to light.  Neck:     Musculoskeletal: Normal range of motion.     Vascular: No JVD.     Trachea: No tracheal deviation.  Pulmonary:     Effort: Pulmonary effort is normal.     Breath sounds: No stridor.  Musculoskeletal:     Comments: Left hand with  moderate dorsal swelling with tenderness palpation to the dorsum more pronounced on the ulnar aspect  Neurological:     Mental Status: He is alert and oriented to person, place, and time.     Coordination: Coordination normal.  Psychiatric:        Behavior: Behavior normal.        Thought Content: Thought content normal.        Judgment: Judgment normal.      ED Treatments / Results  Labs (all labs ordered are listed, but only abnormal results are displayed) Labs Reviewed - No data to display  EKG None  Radiology Dg Hand Complete Left  Result Date: 09/17/2019 CLINICAL DATA:  Pain following recent fall EXAM: LEFT HAND - COMPLETE 3+ VIEW COMPARISON:  Left wrist radiographs August 25, 2014 FINDINGS: Frontal, oblique, and lateral views were obtained. There is a small avulsion along the dorsal aspect of the triquetrum bone. No other acute fracture evident. No dislocation. Mild remodeling in the distal fifth metacarpal may be indicative of old injury in this area. Joint spaces appear normal. No erosive change. IMPRESSION: Small avulsion arising from the dorsal triquetrum. Question old injury with remodeling involving the distal fifth metacarpal. No dislocation. No appreciable arthropathy. Electronically Signed   By: Bretta BangWilliam  Woodruff III M.D.   On: 09/17/2019 09:18    Procedures Procedures (including critical care time)  Medications Ordered in ED Medications  oxyCODONE-acetaminophen (PERCOCET/ROXICET) 5-325 MG per tablet 1 tablet (1 tablet Oral Given 09/17/19 1059)     Initial Impression / Assessment and Plan / ED Course  I have reviewed the triage vital signs and the nursing notes.  Pertinent labs & imaging results that were available during my care of the patient were reviewed by me and considered in my medical decision making (see chart for details).        38 year old male presents today with what appears to be acute triquetrum fracture.  Orthopedics consulted who evaluated  the patient at bedside.  Patient was placed in a splint and follow-up instructions were given by orthopedics.  Patient discharged with pain medication strict return precautions.  Verbalized understanding and agreement to this plan.  Final Clinical Impressions(s) / ED Diagnoses   Final diagnoses:  Closed nondisplaced fracture of triquetrum of left wrist, initial encounter    ED Discharge Orders         Ordered    acetaminophen (TYLENOL) 325 MG tablet  Every 6 hours     09/17/19 1141    ibuprofen (  ADVIL) 200 MG tablet  Every 6 hours     09/17/19 1141    traMADol (ULTRAM) 50 MG tablet  Every 6 hours PRN     09/17/19 1141    oxyCODONE-acetaminophen (PERCOCET/ROXICET) 5-325 MG tablet  Every 6 hours PRN     09/17/19 1223           Okey Regal, PA-C 09/17/19 1223    Charlesetta Shanks, MD 09/19/19 [phone]

## 2019-09-17 NOTE — Discharge Instructions (Addendum)
Please read attached information. If you experience any new or worsening signs or symptoms please return to the emergency room for evaluation. Please follow-up with your primary care provider or specialist as discussed. Please use medication prescribed only as directed and discontinue taking if you have any concerning signs or symptoms.   °

## 2019-09-21 ENCOUNTER — Encounter (HOSPITAL_BASED_OUTPATIENT_CLINIC_OR_DEPARTMENT_OTHER): Payer: Self-pay | Admitting: *Deleted

## 2019-09-21 ENCOUNTER — Emergency Department (HOSPITAL_BASED_OUTPATIENT_CLINIC_OR_DEPARTMENT_OTHER)
Admission: EM | Admit: 2019-09-21 | Discharge: 2019-09-22 | Disposition: A | Discharge status: Home / Self Care | Payer: Medicaid Other | Attending: Emergency Medicine | Admitting: Emergency Medicine

## 2019-09-21 ENCOUNTER — Emergency Department (HOSPITAL_BASED_OUTPATIENT_CLINIC_OR_DEPARTMENT_OTHER): Payer: Medicaid Other

## 2019-09-21 ENCOUNTER — Other Ambulatory Visit: Payer: Self-pay

## 2019-09-21 DIAGNOSIS — F17210 Nicotine dependence, cigarettes, uncomplicated: Secondary | ICD-10-CM | POA: Insufficient documentation

## 2019-09-21 DIAGNOSIS — Y999 Unspecified external cause status: Secondary | ICD-10-CM | POA: Diagnosis not present

## 2019-09-21 DIAGNOSIS — M25421 Effusion, right elbow: Secondary | ICD-10-CM

## 2019-09-21 DIAGNOSIS — M25521 Pain in right elbow: Secondary | ICD-10-CM | POA: Diagnosis not present

## 2019-09-21 DIAGNOSIS — Y929 Unspecified place or not applicable: Secondary | ICD-10-CM | POA: Diagnosis not present

## 2019-09-21 DIAGNOSIS — Y9351 Activity, roller skating (inline) and skateboarding: Secondary | ICD-10-CM | POA: Insufficient documentation

## 2019-09-21 DIAGNOSIS — M25531 Pain in right wrist: Secondary | ICD-10-CM | POA: Diagnosis not present

## 2019-09-21 DIAGNOSIS — Z79899 Other long term (current) drug therapy: Secondary | ICD-10-CM | POA: Diagnosis not present

## 2019-09-21 MED ORDER — HYDROCODONE-ACETAMINOPHEN 5-325 MG PO TABS: 1.0000 | ORAL_TABLET | Freq: Four times a day (QID) | ORAL | 0 refills | Status: DC | PRN

## 2019-09-21 MED ORDER — ONDANSETRON 4 MG PO TBDP
4.0000 mg | ORAL_TABLET | Freq: Once | ORAL | Status: AC
  Administered 2019-09-21: 4 mg via ORAL
  Filled 2019-09-21: qty 1

## 2019-09-21 MED ORDER — HYDROCODONE-ACETAMINOPHEN 5-325 MG PO TABS
1.0000 | ORAL_TABLET | Freq: Once | ORAL | Status: AC
  Administered 2019-09-21: 1 via ORAL
  Filled 2019-09-21: qty 1

## 2019-09-21 NOTE — Consult Note (Signed)
ORTHOPAEDIC CONSULTATION HISTORY & PHYSICAL REQUESTING PHYSICIAN: Quintella Reichert, MD  Chief Complaint: Right elbow injury  HPI: Leonard [name] is a 38 y.o. male who fell off a skateboard today, sustaining an injury to the right elbow.  He has pain and diminished motion.  X-rays reveal anterior fat pad consistent with an effusion, and a possible nondisplaced neck fracture.  He is recovering from a subacute left wrist injury as well.  Past Medical History:  Diagnosis Date  . Alcohol abuse   . 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 No pertinent surgical history. Social History   Socioeconomic History  . Marital status: Married    Spouse name: Not on file  . Number of children: Not on file  . Years of education: Not on file  . Highest education level: Not on file  Occupational History  . Not on file  Social Needs  . Financial resource strain: Not on file  . Food insecurity    Worry: Not on file    Inability: Not on file  . Transportation needs    Medical: Not on file    Non-medical: Not on file  Tobacco Use  . Smoking status: Current Some Day Smoker    Packs/day: 0.25    Types: Cigarettes  . Smokeless tobacco: Never Used  Substance and Sexual Activity  . Alcohol use: Yes    Alcohol/week: 70.0 standard drinks    Types: 70 Shots of liquor per week    Comment: 2 pints cognac weekly, last drink 3/17  . Drug use: Yes    Types: Cocaine, Marijuana  . Sexual activity: Yes  Lifestyle  . Physical activity    Days per week: Not on file    Minutes per session: Not on file  . Stress: Not on file  Relationships  . Social Herbalist on phone: Not on file    Gets together: Not on file    Attends religious service: Not on file    Active member of club or organization: Not on file    Attends meetings of clubs or organizations: Not on file    Relationship status: Not on file  Other Topics Concern  . Not on file  Social History Narrative  . Not on  file   Family History  Problem Relation Age of Onset  . Cancer Other   . Hypertension Other    No Known Allergies Prior to Admission medications   Medication Sig Start Date End Date Taking? Authorizing Provider  acetaminophen (TYLENOL) 325 MG tablet Take 2 tablets (650 mg total) by mouth every 6 (six) hours. 09/17/19   Milly Jakob, MD  chlorhexidine (PERIDEX) 0.12 % solution Use as directed 15 mLs in the mouth or throat 2 (two) times daily. Patient not taking: Reported on 08/30/2018 07/04/17   Volanda Napoleon, PA-C  diclofenac (VOLTAREN) 50 MG EC tablet Take 1 tablet (50 mg total) by mouth 2 (two) times daily. Patient not taking: Reported on 08/30/2018 04/08/17   Kerrie Buffalo, NP  gabapentin (NEURONTIN) 300 MG capsule Take 1 capsule (300 mg total) by mouth 3 (three) times daily. Patient not taking: Reported on 08/30/2018 06/13/17   Patrecia Pour, NP  ibuprofen (ADVIL) 200 MG tablet Take 3 tablets (600 mg total) by mouth every 6 (six) hours. 09/17/19   Milly Jakob, MD  LORazepam (ATIVAN PO) Take 1 tablet by mouth once.    [provider]  traMADol (ULTRAM) 50 MG tablet  Take 1 tablet (50 mg total) by mouth every 6 (six) hours as needed (severe pain not already relieved by tylenol and ibuprofen). 09/17/19 09/16/20  Mack Hook, MD   Dg Elbow Complete Right  Result Date: 09/21/2019 CLINICAL DATA:  Larey Seat off skateboard EXAM: RIGHT ELBOW - COMPLETE 3+ VIEW COMPARISON:  None. FINDINGS: Elbow effusion is present. No radial head dislocation. No definitive fracture lucency is seen IMPRESSION: No definitive fracture lucency seen however there is large elbow effusion and occult fracture remains a concern. Electronically Signed   By: Jasmine Pang M.D.   On: 09/21/2019 22:20   Dg Forearm Right  Result Date: 09/21/2019 CLINICAL DATA:  Larey Seat off skateboard EXAM: RIGHT FOREARM - 2 VIEW COMPARISON:  None. FINDINGS: Suspected elbow effusion.  No fracture or malalignment is seen.  IMPRESSION: Elbow effusion.  No definite acute osseous abnormality Electronically Signed   By: Jasmine Pang M.D.   On: 09/21/2019 22:18   Dg Wrist Complete Right  Result Date: 09/21/2019 CLINICAL DATA:  Larey Seat off skateboard EXAM: RIGHT WRIST - COMPLETE 3+ VIEW COMPARISON:  None. FINDINGS: There is no evidence of fracture or dislocation. There is no evidence of arthropathy or other focal bone abnormality. Soft tissues are unremarkable. IMPRESSION: Negative. Electronically Signed   By: Jasmine Pang M.D.   On: 09/21/2019 22:17    Positive ROS: All other systems have been reviewed and were otherwise negative with the exception of those mentioned in the HPI and as above.  Physical Exam: Vitals: Refer to EMR. Constitutional:  WD, WN, NAD HEENT:  NCAT, EOMI Neuro/Psych:  Alert & oriented to person, place, and time; appropriate mood & affect Lymphatic: No generalized extremity edema or lymphadenopathy Extremities / MSK:  The extremities are normal with respect to appearance, ranges of motion, joint stability, muscle strength/tone, sensation, & perfusion except as otherwise noted:  Right elbow is tender, markedly so over the proximal radius.  Increased pain with passive forearm rotation, as well as elbow flexion and extension.  He only allows about a 40 degree arc of flexion extension before limited by discomfort, and only about 30 degrees each with regard to pronation supination.  Otherwise MVI, no discernible bruising  Assessment: Probable right elbow nondisplaced radial neck fracture  Plan: I discussed these findings with him.  I recommended resting the elbow for comfort sake in a sling, but begin to work on range of motion exercises acutely to prevent elbow stiffness.  Anti-inflammatories can be helpful.  My office will call him on Monday to arrange for reevaluation sometime during the week of the ninth.  Cliffton Asters Janee Morn, MD      Orthopaedic & Hand Surgery [facility]  [facility] [address] [place], [place]  [address] Office: [phone] Mobile: [phone]  09/21/2019, 11:04 PM

## 2019-09-21 NOTE — Discharge Instructions (Addendum)
Rest your right elbow in the sling, begin working range of motion exercises as I instructed you, beginning fairly soon to prevent elbow stiffness.

## 2019-09-21 NOTE — ED Triage Notes (Signed)
He fell off a skateboard. Injury to his right arm from his wrist to his elbow.

## 2019-09-22 MED ORDER — HYDROCODONE-ACETAMINOPHEN 5-325 MG PO TABS: 1.0000 | ORAL_TABLET | Freq: Four times a day (QID) | ORAL | 0 refills | Status: DC | PRN

## 2019-09-22 NOTE — ED Notes (Signed)
Hydrocodone prescription sent to [facility] on [address]; this nurse called [facility] on [address] and left a message to cancel the prescription for hydrocodone for the patient.

## 2019-09-22 NOTE — ED Provider Notes (Signed)
[facility] EMERGENCY DEPARTMENT Provider Note   CSN: [id number] Arrival date & time: 09/21/19  2059     History   Chief Complaint Chief Complaint  Patient presents with  . Fall    HPI Leonard [name] is a 38 y.o. male.     The history is provided by the patient, the spouse and medical records. No language interpreter was used.  Fall   Leonard [name] is a 38 y.o. male who presents to the Emergency Department complaining of fall. He presents to the emergency department for evaluation of injuries following a fall that occurred earlier today. He was riding on a skateboard when went out from underneath him and he fell onto his right side and felt his right arm twist. He complains of severe pain at the right elbow and wrist, worse with supination and pronation. He is right arm dominant. He did injure his left wrist four days ago and is currently in a splint. He has no medical problems and takes no medications. Past Medical History:  Diagnosis Date  . Alcohol abuse   . Seizures ([facility])    while detoxing    Patient Active Problem List   Diagnosis Date Noted  . Cannabis abuse 06/13/2017  . Substance induced mood disorder ([facility]) 06/13/2017    History reviewed. No pertinent surgical history.      Home Medications    Prior to Admission medications   Medication Sig Start Date End Date Taking? Authorizing Provider  acetaminophen (TYLENOL) 325 MG tablet Take 2 tablets (650 mg total) by mouth every 6 (six) hours. 09/17/19   Mack Hookhompson, David, MD  chlorhexidine (PERIDEX) 0.12 % solution Use as directed 15 mLs in the mouth or throat 2 (two) times daily. Patient not taking: Reported on 08/30/2018 07/04/17   Maxwell CaulLayden, Lindsey A, PA-C  diclofenac (VOLTAREN) 50 MG EC tablet Take 1 tablet (50 mg total) by mouth 2 (two) times daily. Patient not taking: Reported on 08/30/2018 04/08/17   Adonis BrookAgustin, Sheila, NP  gabapentin (NEURONTIN) 300 MG capsule Take 1 capsule (300 mg total) by  mouth 3 (three) times daily. Patient not taking: Reported on 08/30/2018 06/13/17   Charm RingsLord, Jamison Y, NP  HYDROcodone-acetaminophen (NORCO/VICODIN) 5-325 MG tablet Take 1 tablet by mouth every 6 (six) hours as needed. 09/22/19   Tilden Fossaees, Malaiya Paczkowski, MD  ibuprofen (ADVIL) 200 MG tablet Take 3 tablets (600 mg total) by mouth every 6 (six) hours. 09/17/19   Mack Hookhompson, David, MD  LORazepam (ATIVAN PO) Take 1 tablet by mouth once.    [provider]  traMADol (ULTRAM) 50 MG tablet Take 1 tablet (50 mg total) by mouth every 6 (six) hours as needed (severe pain not already relieved by tylenol and ibuprofen). 09/17/19 09/16/20  Mack Hookhompson, David, MD    Family History Family History  Problem Relation Age of Onset  . Cancer Other   . Hypertension Other     Social History Social History   Tobacco Use  . Smoking status: Current Some Day Smoker    Packs/day: 0.25    Types: Cigarettes  . Smokeless tobacco: Never Used  Substance Use Topics  . Alcohol use: Yes    Alcohol/week: 70.0 standard drinks    Types: 70 Shots of liquor per week    Comment: 2 pints cognac weekly, last drink 3/17  . Drug use: Yes    Types: Cocaine, Marijuana     Allergies   Patient has no known allergies.   Review of Systems Review of Systems  All other systems reviewed and are negative.    Physical Exam Updated Vital Signs BP 126/79 (BP Location: Right Arm)   Pulse 73   Temp 98.7 F (37.1 C) (Oral)   Resp 18   Ht 6\' 1"  (1.854 m)   Wt 93 kg   SpO2 97%   BMI 27.05 kg/m   Physical Exam Vitals signs and nursing note reviewed.  Constitutional:      Appearance: He is well-developed.  HENT:     Head: Normocephalic and atraumatic.  Cardiovascular:     Rate and Rhythm: Normal rate and regular rhythm.  Pulmonary:     Effort: Pulmonary effort is normal. No respiratory distress.  Musculoskeletal:     Comments: 2+ radial pulses bilaterally. There is mild tenderness over the right radial aspect of the wrist  as well as the right elbow. Position of comfort is flexion of the elbow. Patient is unable to supination/pronated without significant discomfort. He can extend the elbow with pain. Five out of five grip strength and bilateral upper extremities. Sensation light touch intact throughout the entire right upper extremity.  Skin:    General: Skin is warm and dry.  Neurological:     Mental Status: He is alert and oriented to person, place, and time.  Psychiatric:        Behavior: Behavior normal.      ED Treatments / Results  Labs (all labs ordered are listed, but only abnormal results are displayed) Labs Reviewed - No data to display  EKG None  Radiology Dg Elbow Complete Right  Result Date: 09/21/2019 CLINICAL DATA:  09/23/2019 off skateboard EXAM: RIGHT ELBOW - COMPLETE 3+ VIEW COMPARISON:  None. FINDINGS: Elbow effusion is present. No radial head dislocation. No definitive fracture lucency is seen IMPRESSION: No definitive fracture lucency seen however there is large elbow effusion and occult fracture remains a concern. Electronically Signed   By: Larey Seat M.D.   On: 09/21/2019 22:20   Dg Forearm Right  Result Date: 09/21/2019 CLINICAL DATA:  09/23/2019 off skateboard EXAM: RIGHT FOREARM - 2 VIEW COMPARISON:  None. FINDINGS: Suspected elbow effusion.  No fracture or malalignment is seen. IMPRESSION: Elbow effusion.  No definite acute osseous abnormality Electronically Signed   By: Larey Seat M.D.   On: 09/21/2019 22:18   Dg Wrist Complete Right  Result Date: 09/21/2019 CLINICAL DATA:  09/23/2019 off skateboard EXAM: RIGHT WRIST - COMPLETE 3+ VIEW COMPARISON:  None. FINDINGS: There is no evidence of fracture or dislocation. There is no evidence of arthropathy or other focal bone abnormality. Soft tissues are unremarkable. IMPRESSION: Negative. Electronically Signed   By: Larey Seat M.D.   On: 09/21/2019 22:17    Procedures Procedures (including critical care time)  Medications Ordered in  ED Medications  HYDROcodone-acetaminophen (NORCO/VICODIN) 5-325 MG per tablet 1 tablet (1 tablet Oral Given 09/21/19 2242)  ondansetron (ZOFRAN-ODT) disintegrating tablet 4 mg (4 mg Oral Given 09/21/19 2241)     Initial Impression / Assessment and Plan / ED Course  I have reviewed the triage vital signs and the nursing notes.  Pertinent labs & imaging results that were available during my care of the patient were reviewed by me and considered in my medical decision making (see chart for details).        Patient here for evaluation of right elbow injury following a fall. Concern for possible ligamentous injury versus occult fracture. Discussed with Dr. 09/23/19 with orthopedics, who evaluated the patient in the emergency department. Plan  to place and sling with range of motion exercises. Discussed with patient home care, outpatient follow-up and return precautions.  Final Clinical Impressions(s) / ED Diagnoses   Final diagnoses:  Effusion of bursa of right elbow    ED Discharge Orders         Ordered    HYDROcodone-acetaminophen (NORCO/VICODIN) 5-325 MG tablet  Every 6 hours PRN     09/22/19 0007    HYDROcodone-acetaminophen (NORCO/VICODIN) 5-325 MG tablet  Every 6 hours PRN,   Status:  Discontinued     09/21/19 2331           Quintella Reichert, MD 09/22/19 0010

## 2020-08-02 IMAGING — DX DG FOREARM 2V*R*
2 series · 2 of 2 positions shown · non-contrast
Comparison: None.

CLINICAL DATA: Fell off skateboard

EXAM:
RIGHT FOREARM - 2 VIEW

[forearm ap]
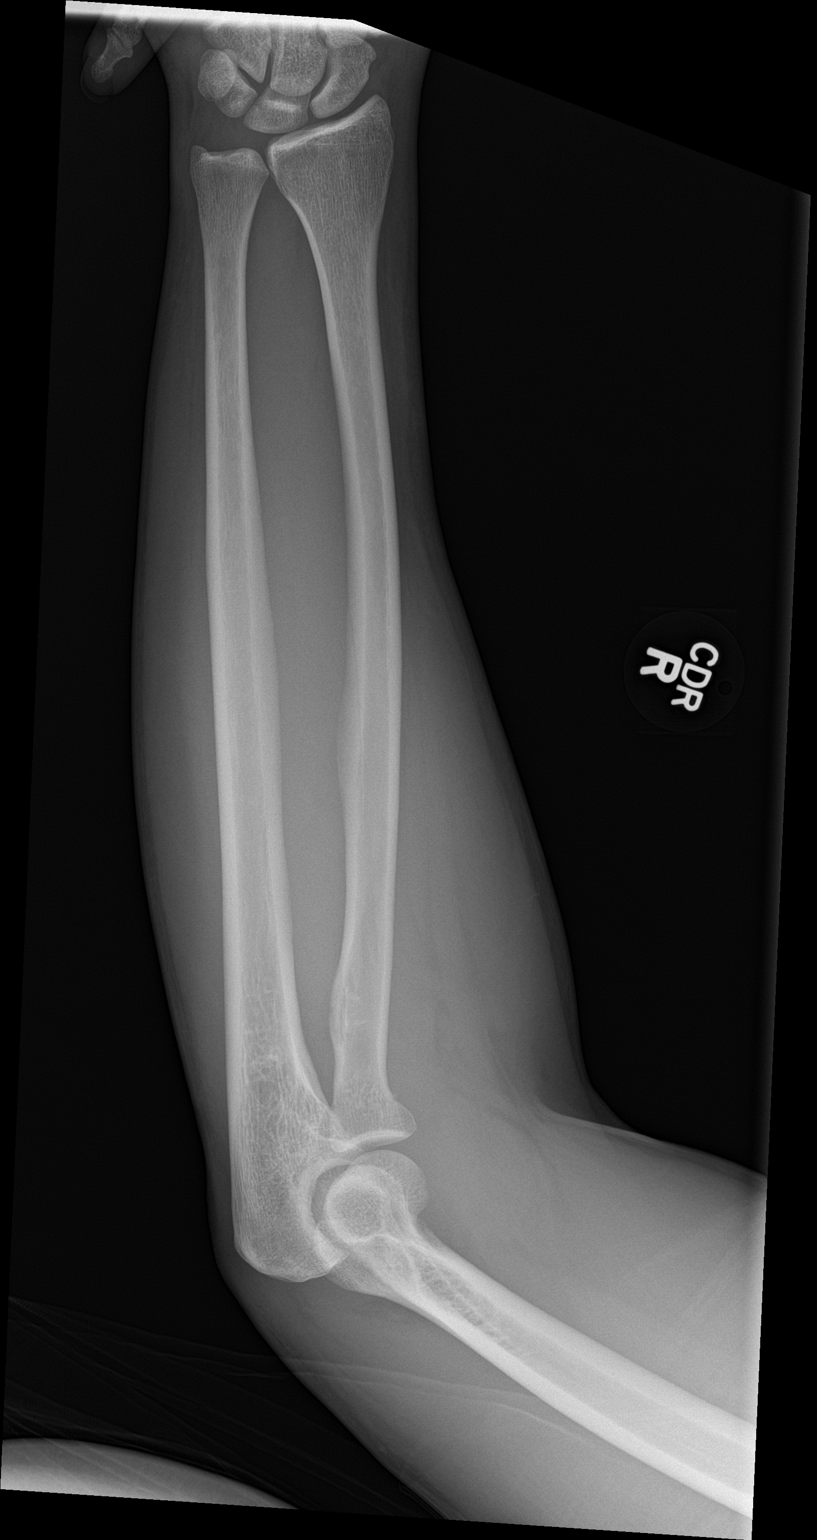

[forearm lat]
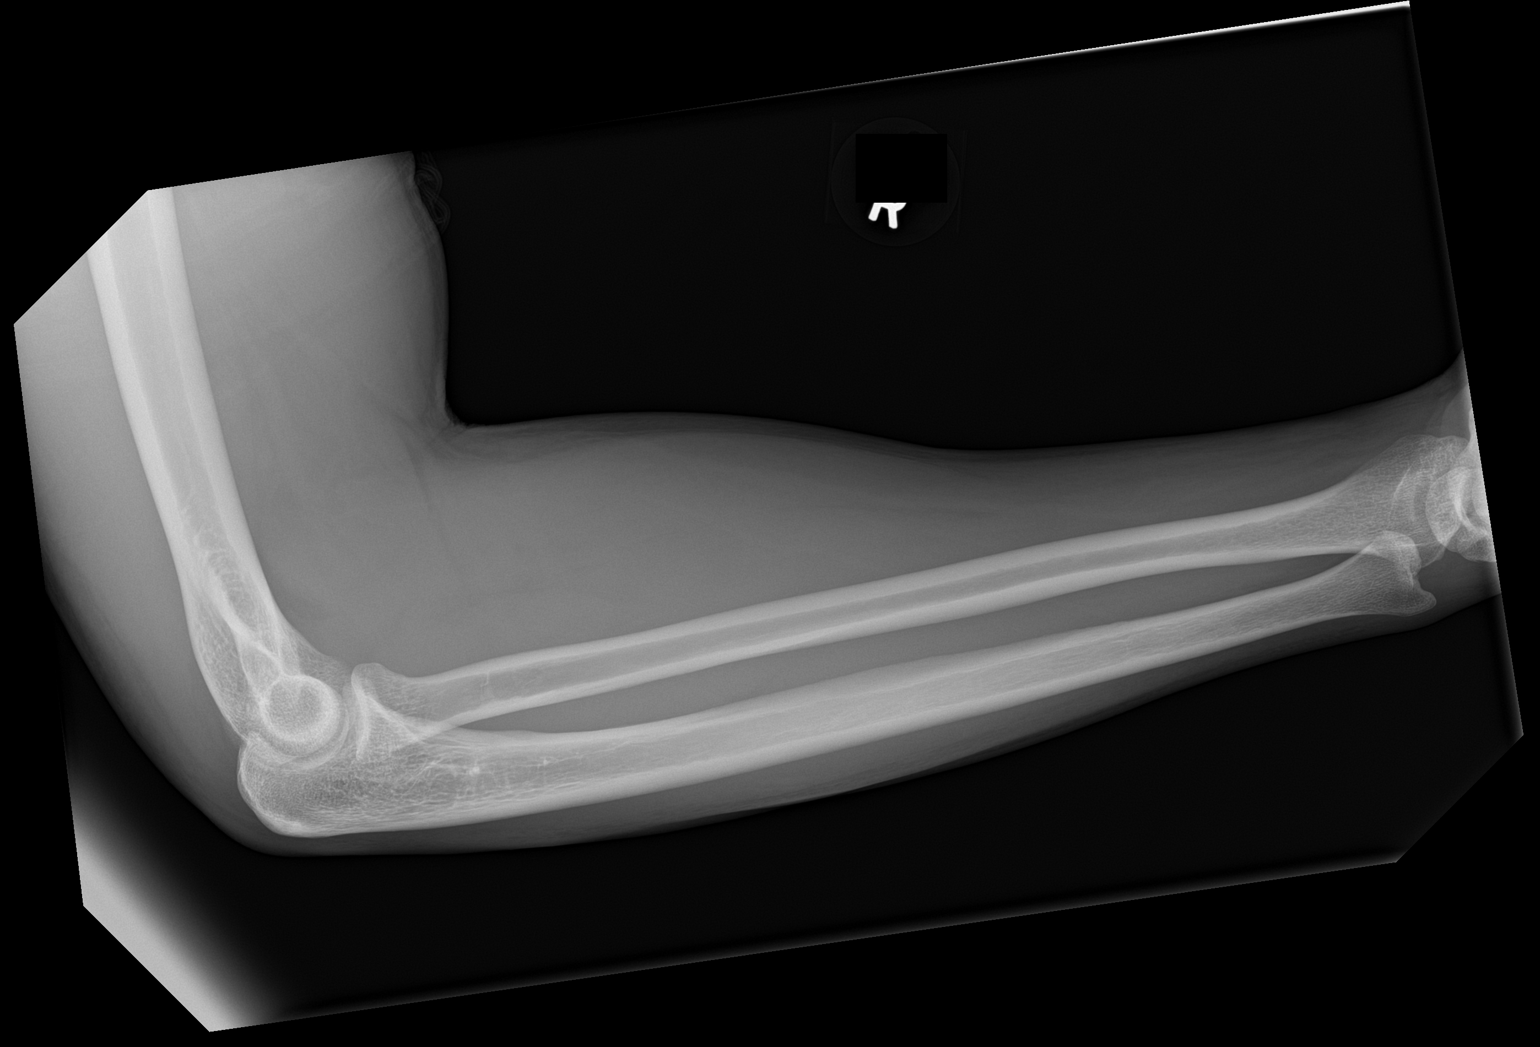

[2 of 2 positions shown; findings below may reference images not displayed]

FINDINGS: Suspected elbow effusion.  No fracture or malalignment is seen.
IMPRESSION: Elbow effusion.  No definite acute osseous abnormality

## 2020-08-02 IMAGING — DX DG WRIST COMPLETE 3+V*R*
1 series · 1 of 1 positions shown · non-contrast
Comparison: None.

CLINICAL DATA: Fell off skateboard

EXAM:
RIGHT WRIST - COMPLETE 3+ VIEW

[wrist pa]
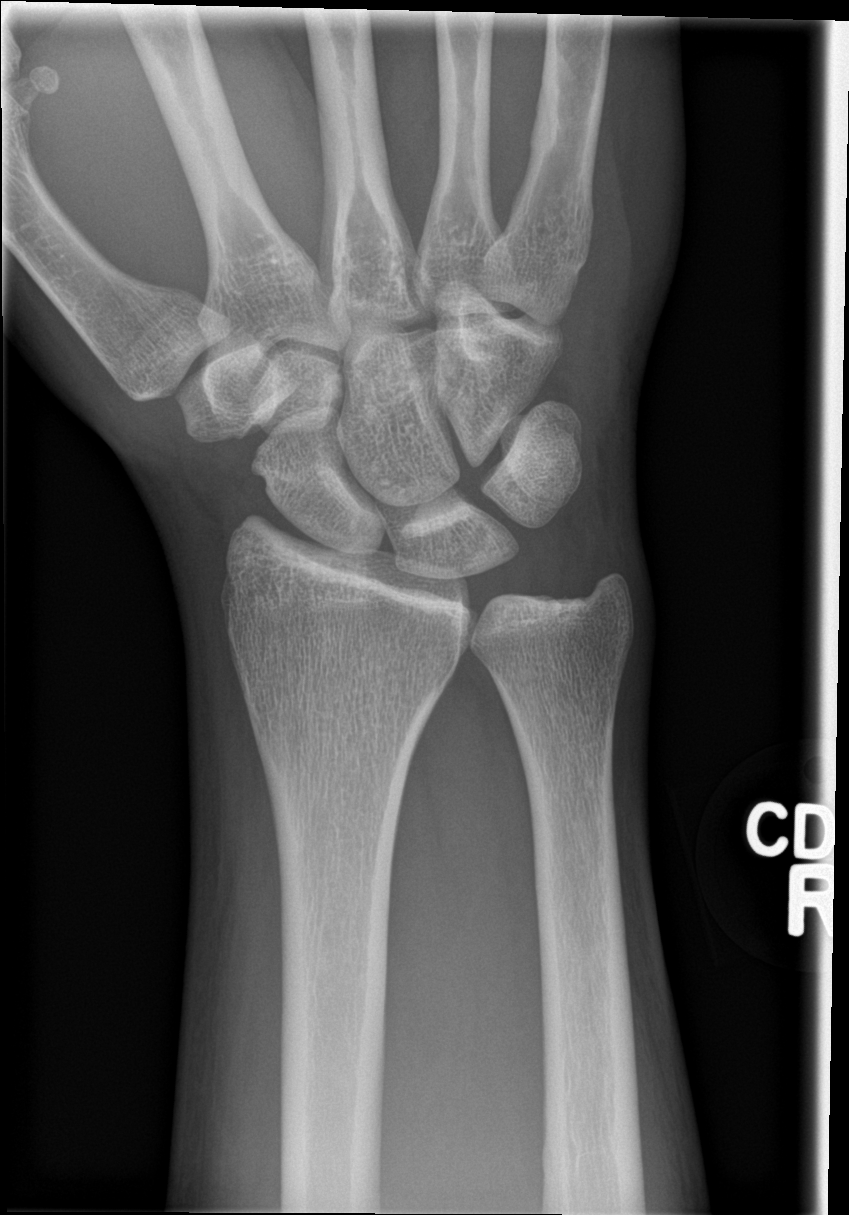

[1 of 1 positions shown; findings below may reference images not displayed]

FINDINGS: There is no evidence of fracture or dislocation. There is no
evidence of arthropathy or other focal bone abnormality. Soft
tissues are unremarkable.
IMPRESSION: Negative.

## 2022-08-16 ENCOUNTER — Telehealth: Payer: Self-pay | Admitting: Cardiology

## 2022-09-06 DIAGNOSIS — F1010 Alcohol abuse, uncomplicated: Secondary | ICD-10-CM | POA: Insufficient documentation

## 2022-09-06 DIAGNOSIS — R569 Unspecified convulsions: Secondary | ICD-10-CM | POA: Insufficient documentation

## 2022-09-07 ENCOUNTER — Ambulatory Visit: Payer: Medicaid Other | Attending: Cardiology | Admitting: Cardiology

## 2022-09-07 ENCOUNTER — Encounter: Payer: Self-pay | Admitting: Cardiology

## 2022-09-07 ENCOUNTER — Ambulatory Visit: Payer: Medicaid Other | Attending: Cardiology

## 2022-09-07 VITALS — BP 130/78 | HR 95 | Ht 72.0 in | Wt 201.2 lb

## 2022-09-07 DIAGNOSIS — R001 Bradycardia, unspecified: Secondary | ICD-10-CM | POA: Diagnosis present

## 2022-09-07 NOTE — Patient Instructions (Signed)
Medication Instructions:  Your physician recommends that you continue on your current medications as directed. Please refer to the Current Medication list given to you today.  *If you need a refill on your cardiac medications before your next appointment, please call your pharmacy*   Lab Work: NONE If you have labs (blood work) drawn today and your tests are completely normal, you will receive your results only by: [place] (if you have MyChart) OR A paper copy in the mail If you have any lab test that is abnormal or we need to change your treatment, we will call you to review the results.   Testing/Procedures: You have been asked to wear a Zio Heart Monitor today. It is to be worn for 7 days. Please remove the monitor on 10/24 and mail back in the box provided.  If you have any questions about the monitor please call the company at [phone]     Follow-Up: At [facility], you and your health needs are our priority.  As part of our continuing mission to provide you with exceptional heart care, we have created designated Provider Care Teams.  These Care Teams include your primary Cardiologist (physician) and Advanced Practice Providers (APPs -  Physician Assistants and Nurse Practitioners) who all work together to provide you with the care you need, when you need it.  We recommend signing up for the patient portal called "MyChart".  Sign up information is provided on this After Visit Summary.  MyChart is used to connect with patients for Virtual Visits (Telemedicine).  Patients are able to view lab/test results, encounter notes, upcoming appointments, etc.  Non-urgent messages can be sent to your provider as well.   To learn more about what you can do with MyChart, go to [email or web address].    Your next appointment:    As Needed  The format for your next appointment:   In Person  Provider:   Shirlee More, MD    Other Instructions   Important Information  About Sugar

## 2022-09-07 NOTE — Progress Notes (Signed)
Cardiology Office Note:    Date:  09/07/2022   ID:  Leonard [name], DOB 01-Apr-1981, MRN [id number]  PCP:  Patient, No Pcp Per  Cardiologist:  Shirlee More, MD   Referring MD: No ref. provider found  ASSESSMENT:    1. Sinus bradycardia    PLAN:    In order of problems listed above:  He is unsettled by the episode of sinus bradycardia I told him in my opinion rather than the cause of this was secondary to events including hypoglycemia seizure but for further evaluation we will wear a 5 to 7-day event monitor and if reassuring I will see back in the office as needed.  Next appointment as needed   Medication Adjustments/Labs and Tests Ordered: Current medicines are reviewed at length with the patient today.  Concerns regarding medicines are outlined above.  No orders of the defined types were placed in this encounter.  No orders of the defined types were placed in this encounter.  Chief complaint my heart rate was slow  History of Present Illness:    Leonard [name] is a 41 y.o. male who is being seen today for the evaluation of sinus bradycardia at the request of No ref. provider found.  He recently presented to [place] ED 08/16/2022 with hypoglycemia altered mental status complaints of chest pain EKG described as sinus bradycardia otherwise normal troponin undetectable on the ED note talks about having sinus bradycardia with a nadir heart rate of 38 other diagnoses included rhabdomyolysis and alcohol use disorder and was prescribed Librium in the emergency department.  Chest x-ray in the emergency room normal CT scan of the head no acute abnormality CBC had a hemoglobin of 16.9 his arterial blood gas showed a pH of 7.30.  He is unsettled by the events when he presented to the hospital with hypoglycemia alcohol withdrawal and seizures. After the episode he was bradycardic sinus heart rate 38 There is no recurrence he has no history of syncope no known history of  heart disease congenital rheumatic murmur or atrial fibrillation He was taking no rate slowing medications Past Medical History:  Diagnosis Date   Alcohol abuse    Seizures ([place])    while detoxing    Past Surgical History:  Procedure Laterality Date   WISDOM TOOTH EXTRACTION      Current Medications: No outpatient medications have been marked as taking for the 09/07/22 encounter (Office Visit) with Richardo Priest, MD.     Allergies:   Patient has no known allergies.   Social History   Socioeconomic History   Marital status: Married    Spouse name: Not on file   Number of children: Not on file   Years of education: Not on file   Highest education level: Not on file  Occupational History   Not on file  Tobacco Use   Smoking status: Some Days    Packs/day: 0.25    Types: Cigarettes   Smokeless tobacco: Never  Substance and Sexual Activity   Alcohol use: Yes    Alcohol/week: 70.0 standard drinks of alcohol    Types: 70 Shots of liquor per week    Comment: 2 pints cognac weekly, last drink 3/17   Drug use: Yes    Types: Cocaine, Marijuana   Sexual activity: Yes  Other Topics Concern   Not on file  Social History Narrative   Not on file   Social Determinants of Health   Financial Resource Strain: Not on file  Food Insecurity: Not on file  Transportation Needs: Not on file  Physical Activity: Not on file  Stress: Not on file  Social Connections: Not on file     Family History: The patient's family history includes Cancer in an other family member; Hypertension in an other family member.  ROS:   ROS Please see the history of present illness.     All other systems reviewed and are negative.  EKGs/Labs/Other Studies Reviewed:    The following studies were reviewed today:   EKG:  EKG is  ordered today.  The ekg ordered today is personally reviewed and demonstrates sinus rhythm 95 bpm the EKG is normal    Physical Exam:    VS:  BP 130/78 (BP Location:  Right Arm, Patient Position: Sitting)   Pulse 95   Ht 6' (1.829 m)   Wt 201 lb 3.2 oz (91.3 kg)   SpO2 96%   BMI 27.29 kg/m     Wt Readings from Last 3 Encounters:  09/07/22 201 lb 3.2 oz (91.3 kg)  09/21/19 205 lb (93 kg)  09/17/19 205 lb (93 kg)     GEN: He appears healthy well nourished, well developed in no acute distress HEENT: Normal NECK: No JVD; No carotid bruits LYMPHATICS: No lymphadenopathy CARDIAC: RRR, no murmurs, rubs, gallops RESPIRATORY:  Clear to auscultation without rales, wheezing or rhonchi  ABDOMEN: Soft, non-tender, non-distended MUSCULOSKELETAL:  No edema; No deformity  SKIN: Warm and dry NEUROLOGIC:  Alert and oriented x 3 PSYCHIATRIC:  Normal affect     Signed, Shirlee More, MD  09/07/2022 3:04 PM    [facility]
